# Patient Record
Sex: Female | Born: 1990
Health system: Southern US, Community
[De-identification: ages and names within clinical notes are randomized; demographics above are authoritative.]

## PROBLEM LIST (undated history)

## (undated) ENCOUNTER — Inpatient Hospital Stay (HOSPITAL_COMMUNITY): Payer: Self-pay

## (undated) DIAGNOSIS — F32A Depression, unspecified: Secondary | ICD-10-CM

## (undated) DIAGNOSIS — F1910 Other psychoactive substance abuse, uncomplicated: Secondary | ICD-10-CM

## (undated) DIAGNOSIS — N760 Acute vaginitis: Secondary | ICD-10-CM

## (undated) DIAGNOSIS — F329 Major depressive disorder, single episode, unspecified: Secondary | ICD-10-CM

## (undated) DIAGNOSIS — B9689 Other specified bacterial agents as the cause of diseases classified elsewhere: Secondary | ICD-10-CM

## (undated) DIAGNOSIS — B1920 Unspecified viral hepatitis C without hepatic coma: Secondary | ICD-10-CM

## (undated) HISTORY — PX: NO PAST SURGERIES: SHX2092

---
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
## 2013-01-11 ENCOUNTER — Ambulatory Visit (INDEPENDENT_AMBULATORY_CARE_PROVIDER_SITE_OTHER): Payer: 59 | Admitting: Nurse Practitioner

## 2013-01-11 ENCOUNTER — Encounter: Payer: Self-pay | Admitting: Nurse Practitioner

## 2013-01-11 VITALS — BP 106/70 | Temp 99.1°F | Wt 119.6 lb

## 2013-01-11 DIAGNOSIS — J069 Acute upper respiratory infection, unspecified: Secondary | ICD-10-CM

## 2013-01-11 DIAGNOSIS — J209 Acute bronchitis, unspecified: Secondary | ICD-10-CM

## 2013-01-11 MED ORDER — HYDROCODONE-HOMATROPINE 5-1.5 MG/5ML PO SYRP: 5.0000 mL | ORAL_SOLUTION | ORAL | Status: DC | PRN

## 2013-01-11 MED ORDER — CEFPROZIL 500 MG PO TABS: 500.0000 mg | ORAL_TABLET | Freq: Two times a day (BID) | ORAL | Status: DC

## 2013-01-11 MED ORDER — CEFTRIAXONE SODIUM 500 MG IJ SOLR
500.0000 mg | Freq: Once | INTRAMUSCULAR | Status: AC
  Administered 2013-01-11: 500 mg via INTRAMUSCULAR

## 2013-01-11 MED ORDER — PREDNISONE 20 MG PO TABS: ORAL_TABLET | ORAL | Status: DC

## 2013-01-11 MED ORDER — ALBUTEROL SULFATE HFA 108 (90 BASE) MCG/ACT IN AERS: 2.0000 | INHALATION_SPRAY | RESPIRATORY_TRACT | Status: DC | PRN

## 2013-01-11 NOTE — Progress Notes (Signed)
Subjective:  Presents for complaints of cough and congestion for the past several days. Began with an intestinal virus, seen at the local ER on 3/16 for IV fluids. Since then has developed a frequent cough producing green mucus. Chest pain and tightness with deep breath and cough. Slight shortness of breath/wheezing at times especially at night. No ear pain. Some headache. Sore throat. Taking fluids well. Voiding normally. Temp 102 this morning.  Objective:   BP 106/70  Temp(Src) 99.1 F (37.3 C) (Oral)  Wt 119 lb 9.6 oz (54.25 kg) NAD. Alert, oriented. TMs clear effusion, no erythema. Pharynx minimal erythema, tonsils 2+ in size, no exudate noted. PND noted. Neck supple with mild soft slightly tender anterior cervical adenopathy. Lungs scattered faint expiratory crackles, mildly diminished breath sounds. No tachypnea. Normal color. Slight shortness of breath with talking. Heart regular rate rhythm.

## 2013-01-11 NOTE — Assessment & Plan Note (Signed)
Prescribed antibiotics, prednisone, albuterol inhaler and Hycodan syrup. As a precaution to cover the possibility early pneumonia, given an injection of Rocephin 500 mg. Call back in 4 days if no significant improvement, call or go to ER over the weekend if worse.

## 2013-01-17 ENCOUNTER — Encounter: Payer: Self-pay | Admitting: *Deleted

## 2013-01-22 ENCOUNTER — Ambulatory Visit: Payer: 59 | Admitting: Nurse Practitioner

## 2013-02-01 ENCOUNTER — Telehealth: Payer: Self-pay | Admitting: Nurse Practitioner

## 2013-02-01 NOTE — Telephone Encounter (Signed)
Pt needs refill on her Klonopin 1 mg Q4-6h or as needed

## 2013-02-02 ENCOUNTER — Other Ambulatory Visit: Payer: Self-pay | Admitting: Nurse Practitioner

## 2013-02-02 MED ORDER — CLONAZEPAM 1 MG PO TABS: ORAL_TABLET | ORAL | Status: DC

## 2013-02-08 ENCOUNTER — Encounter: Payer: 59 | Admitting: Family Medicine

## 2013-02-09 ENCOUNTER — Ambulatory Visit (INDEPENDENT_AMBULATORY_CARE_PROVIDER_SITE_OTHER): Payer: 59 | Admitting: Family Medicine

## 2013-02-09 ENCOUNTER — Encounter: Payer: Self-pay | Admitting: Family Medicine

## 2013-02-09 ENCOUNTER — Telehealth: Payer: Self-pay | Admitting: Family Medicine

## 2013-02-09 VITALS — BP 110/76 | Temp 99.1°F | Wt 118.0 lb

## 2013-02-09 DIAGNOSIS — I889 Nonspecific lymphadenitis, unspecified: Secondary | ICD-10-CM

## 2013-02-09 MED ORDER — AMOXICILLIN-POT CLAVULANATE 875-125 MG PO TABS: 1.0000 | ORAL_TABLET | Freq: Two times a day (BID) | ORAL | Status: AC

## 2013-02-09 NOTE — Progress Notes (Signed)
This encounter was created in error - please disregard.

## 2013-02-09 NOTE — Telephone Encounter (Signed)
We'll see, (booo)

## 2013-02-09 NOTE — Progress Notes (Signed)
  Subjective:    Patient ID: Shelia Anderson, female    DOB: 20-May-1991, 22 y.o.   MRN: [id number]  Sore Throat  This is a new problem. The current episode started in the past 7 days. The problem has been resolved. Associated symptoms include headaches and swollen glands. She has tried NSAIDs for the symptoms. The treatment provided mild relief.  Fever  Associated symptoms include headaches.      Review of Systems  Constitutional: Positive for fever.  Neurological: Positive for headaches.   Review systems otherwise negative.    Objective:   Physical Exam  Alert moderate now lays. HEENT positive frontal tenderness. Pharynx erythematous. Neck supple. Lungs rare rhonchi. No wheezes no crackles heart rare in rhythm.      Assessment & Plan:  Impression sinusitis with element of bronchitis. Plan Augmentin 875 twice a day 10 days. Symptomatic care discussed. WSL
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
## 2013-04-18 ENCOUNTER — Ambulatory Visit: Payer: 59 | Admitting: Nurse Practitioner

## 2013-04-18 DIAGNOSIS — Z0289 Encounter for other administrative examinations: Secondary | ICD-10-CM

## 2014-06-25 ENCOUNTER — Emergency Department (HOSPITAL_COMMUNITY)
Admission: EM | Admit: 2014-06-25 | Discharge: 2014-06-25 | Disposition: A | Discharge status: Home / Self Care | Payer: 59 | Attending: Emergency Medicine | Admitting: Emergency Medicine

## 2014-06-25 ENCOUNTER — Encounter (HOSPITAL_COMMUNITY): Payer: Self-pay | Admitting: Emergency Medicine

## 2014-06-25 DIAGNOSIS — Z046 Encounter for general psychiatric examination, requested by authority: Secondary | ICD-10-CM | POA: Insufficient documentation

## 2014-06-25 DIAGNOSIS — Z8619 Personal history of other infectious and parasitic diseases: Secondary | ICD-10-CM | POA: Diagnosis not present

## 2014-06-25 DIAGNOSIS — Z8742 Personal history of other diseases of the female genital tract: Secondary | ICD-10-CM | POA: Insufficient documentation

## 2014-06-25 DIAGNOSIS — F1210 Cannabis abuse, uncomplicated: Secondary | ICD-10-CM | POA: Diagnosis not present

## 2014-06-25 DIAGNOSIS — F17200 Nicotine dependence, unspecified, uncomplicated: Secondary | ICD-10-CM | POA: Insufficient documentation

## 2014-06-25 DIAGNOSIS — Z3202 Encounter for pregnancy test, result negative: Secondary | ICD-10-CM | POA: Insufficient documentation

## 2014-06-25 DIAGNOSIS — F1410 Cocaine abuse, uncomplicated: Secondary | ICD-10-CM | POA: Insufficient documentation

## 2014-06-25 DIAGNOSIS — Z91040 Latex allergy status: Secondary | ICD-10-CM | POA: Insufficient documentation

## 2014-06-25 DIAGNOSIS — F1910 Other psychoactive substance abuse, uncomplicated: Secondary | ICD-10-CM

## 2014-06-25 HISTORY — DX: Acute vaginitis: N76.0

## 2014-06-25 HISTORY — DX: Other specified bacterial agents as the cause of diseases classified elsewhere: B96.89

## 2014-06-25 LAB — CBC WITH DIFFERENTIAL/PLATELET
BASOS ABS: 0 10*3/uL (ref 0.0–0.1)
BASOS PCT: 1 % (ref 0–1)
EOS ABS: 0.2 10*3/uL (ref 0.0–0.7)
Eosinophils Relative: 2 % (ref 0–5)
HCT: 43.2 % (ref 36.0–46.0)
HEMOGLOBIN: 15 g/dL (ref 12.0–15.0)
Lymphocytes Relative: 27 % (ref 12–46)
Lymphs Abs: 2.3 10*3/uL (ref 0.7–4.0)
MCH: 33.6 pg (ref 26.0–34.0)
MCHC: 34.7 g/dL (ref 30.0–36.0)
MCV: 96.9 fL (ref 78.0–100.0)
MONOS PCT: 8 % (ref 3–12)
Monocytes Absolute: 0.7 10*3/uL (ref 0.1–1.0)
NEUTROS PCT: 62 % (ref 43–77)
Neutro Abs: 5.6 10*3/uL (ref 1.7–7.7)
PLATELETS: 423 10*3/uL — AB (ref 150–400)
RBC: 4.46 MIL/uL (ref 3.87–5.11)
RDW: 12.9 % (ref 11.5–15.5)
WBC: 8.8 10*3/uL (ref 4.0–10.5)

## 2014-06-25 LAB — RAPID URINE DRUG SCREEN, HOSP PERFORMED
AMPHETAMINES: NOT DETECTED
Barbiturates: NOT DETECTED
Benzodiazepines: NOT DETECTED
Cocaine: NOT DETECTED
Opiates: NOT DETECTED
TETRAHYDROCANNABINOL: POSITIVE — AB

## 2014-06-25 LAB — BASIC METABOLIC PANEL
ANION GAP: 10 (ref 5–15)
BUN: 8 mg/dL (ref 6–23)
CO2: 25 mEq/L (ref 19–32)
Calcium: 9.1 mg/dL (ref 8.4–10.5)
Chloride: 105 mEq/L (ref 96–112)
Creatinine, Ser: 0.78 mg/dL (ref 0.50–1.10)
Glucose, Bld: 95 mg/dL (ref 70–99)
POTASSIUM: 4.1 meq/L (ref 3.7–5.3)
Sodium: 140 mEq/L (ref 137–147)

## 2014-06-25 LAB — ETHANOL: Alcohol, Ethyl (B): <11 mg/dL (ref 0–11)

## 2014-06-25 LAB — PREGNANCY, URINE: PREG TEST UR: NEGATIVE

## 2014-06-25 MED ORDER — ONDANSETRON HCL 4 MG PO TABS: 4.0000 mg | ORAL_TABLET | Freq: Four times a day (QID) | ORAL | Status: DC

## 2014-06-25 MED ORDER — CLONIDINE HCL 0.1 MG PO TABS: 0.1000 mg | ORAL_TABLET | Freq: Two times a day (BID) | ORAL | Status: DC

## 2014-06-25 MED ORDER — LOPERAMIDE HCL 2 MG PO CAPS: 2.0000 mg | ORAL_CAPSULE | Freq: Four times a day (QID) | ORAL | Status: DC | PRN

## 2014-06-25 MED ORDER — METRONIDAZOLE 500 MG PO TABS: 2000.0000 mg | ORAL_TABLET | Freq: Once | ORAL | Status: DC

## 2014-06-25 NOTE — ED Notes (Signed)
Pt denies SI or HI .  

## 2014-06-25 NOTE — Discharge Instructions (Signed)
Polysubstance Abuse When people abuse more than one drug or type of drug it is called polysubstance or polydrug abuse. For example, many smokers also drink alcohol. This is one form of polydrug abuse. Polydrug abuse also refers to the use of a drug to counteract an unpleasant effect produced by another drug. It may also be used to help with withdrawal from another drug. People who take stimulants may become agitated. Sometimes this agitation is countered with a tranquilizer. This helps protect against the unpleasant side effects. Polydrug abuse also refers to the use of different drugs at the same time.  Anytime drug use is interfering with normal living activities, it has become abuse. This includes problems with family and friends. Psychological dependence has developed when your mind tells you that the drug is needed. This is usually followed by physical dependence which has developed when continuing increases of drug are required to get the same feeling or "high". This is known as addiction or chemical dependency. A person's risk is much higher if there is a history of chemical dependency in the family. SIGNS OF CHEMICAL DEPENDENCY  You have been told by friends or family that drugs have become a problem.  You fight when using drugs.  You are having blackouts (not remembering what you do while using).  You feel sick from using drugs but continue using.  You lie about use or amounts of drugs (chemicals) used.  You need chemicals to get you going.  You are suffering in work performance or in school because of drug use.  You get sick from use of drugs but continue to use anyway.  You need drugs to relate to people or feel comfortable in social situations.  You use drugs to forget problems. "Yes" answered to any of the above signs of chemical dependency indicates there are problems. The longer the use of drugs continues, the greater the problems will become. If there is a family history of  drug or alcohol use, it is best not to experiment with these drugs. Continual use leads to tolerance. After tolerance develops more of the drug is needed to get the same feeling. This is followed by addiction. With addiction, drugs become the most important part of life. It becomes more important to take drugs than participate in the other usual activities of life. This includes relating to friends and family. Addiction is followed by dependency. Dependency is a condition where drugs are now needed not just to get high, but to feel normal. Addiction cannot be cured but it can be stopped. This often requires outside help and the care of professionals. Treatment centers are listed in the yellow pages under: Cocaine, Narcotics, and Alcoholics Anonymous. Most hospitals and clinics can refer you to a specialized care center. Talk to your caregiver if you need help. Document Released: 05/26/2005 Document Revised: 12/27/2011 Document Reviewed: 10/04/2005 [facility] Patient Information 2015 [place], [place]. This information is not intended to replace advice given to you by your health care provider. Make sure you discuss any questions you have with your health care provider.      Behavioral Health Resources in the [facility]  Intensive Outpatient Programs: [facility]      [address] [place], [place] [phone] Both a day and evening program       [facility] Outpatient     [address]        [place], [place] [address] [phone]  [facility]: [facility] [address] [place] [phone]  [facility] Mental Health ACCESS LINE: [phone] or [phone] [address] [place], [place] [address] [email or web address]  Mobile Crisis Teams:                                        [facility]         Mobile Crisis Care Unit [phone]             [facility] [address] [place] Otto [phone]                                         [facility] [address] [place] [phone]  Self-Help/Support Groups: [facility] [employer] of support groups [phone] (call for more info)  Narcotics Anonymous (NA) [facility] [address] [place] - 2 meetings at this location  Residential Treatment Programs:  [facility]      [address]        [place]       [phone]         [facility] [address], [place] [address] [place], [place]  [address] [phone]  [facility]  [address] [place], [place] [address] [phone] Admissions: 8am-3pm M-F  [facility]     [address]        [place], [place] [address]       [phone]         [facility] [address] [place], [place] [phone]  [facility] [address] [place], [place] [phone]  [facility] - Intensive Outpatient      [address]     [place], [place]       [phone]         [facility] ([facility]     [address] [place], [place] [phone] or [phone]  [facility] ([facility])  [address] [place], [place] [phone]  [facility] [address]                                               [address] [place] [phone]  [facility] Resources: [phone]               General Therapy                                                Angie Fava, PhD        [address] [place], [place] [address]         [phone]   Insurance  [facility]   [address] [place], [place] [address]  [phone]  [facility] [address] [place], [place] [address] [phone] Insurance/Medicaid/sponsorship  through [facility] and Families                                              [address]                                        [place], [place] [address]    Therapy/tele-psych/case         [phone]          [facility] [address], [place]  [address]  Adolescent/group home/case management [phone]                                           Creola Corn PhD       General therapy       Insurance   [phone]         Dr. Lolly Mustache Insurance [phone] M-F  [place] [facility]Residential Medicaid, sponsorship [phone]

## 2014-06-25 NOTE — ED Provider Notes (Signed)
CSN: [id number]     Arrival date & time 06/25/14  1249 History   First MD Initiated Contact with Patient 06/25/14 1526     Chief Complaint  Patient presents with  . V70.1   HPI PT has been abusing crack cocaine since January and has also been taking pain pills for 2 years.  Pt has also been abusing prescription drugs including narcotic pain pills.  No benzodiazepines.  NO alcohol.  Pt would like to get help with that.  She denies any SI or HI.  She called behavioral health hospital and was told to come to the ED to stay for 3 days.  Pt also misplaced her flagyl that she was given previously at another ED.  Past Medical History  Diagnosis Date  . BV (bacterial vaginosis)    History reviewed. No pertinent past surgical history. History reviewed. No pertinent family history. History  Substance Use Topics  . Smoking status: Current Every Day Smoker -- 0.30 packs/day    Types: Cigarettes  . Smokeless tobacco: Never Used  . Alcohol Use: No   OB History   Grav Para Term Preterm Abortions TAB SAB Ect Mult Living                 Review of Systems  All other systems reviewed and are negative.     Allergies  Latex  Home Medications   Prior to Admission medications   Not on File   BP 122/70  Pulse 85  Temp(Src) 98.6 F (37 C) (Oral)  Resp 18  Ht  (1.651 m)  Wt 128 lb (58.06 kg)  BMI 21.30 kg/m2  SpO2 100%  LMP 06/24/2014 Physical Exam  Nursing note and vitals reviewed. Constitutional: She appears well-developed and well-nourished. No distress.  HENT:  Head: Normocephalic and atraumatic.  Right Ear: External ear normal.  Left Ear: External ear normal.  Eyes: Conjunctivae are normal. Right eye exhibits no discharge. Left eye exhibits no discharge. No scleral icterus.  Neck: Neck supple. No tracheal deviation present.  Cardiovascular: Normal rate, regular rhythm and intact distal pulses.   Pulmonary/Chest: Effort normal and breath sounds normal. No stridor. No  respiratory distress. She has no wheezes. She has no rales.  Abdominal: Soft. Bowel sounds are normal. She exhibits no distension. There is no tenderness. There is no rebound and no guarding.  Musculoskeletal: She exhibits no edema and no tenderness.  Neurological: She is alert. She has normal strength. No cranial nerve deficit (no facial droop, extraocular movements intact, no slurred speech) or sensory deficit. She exhibits normal muscle tone. She displays no seizure activity. Coordination normal.  Skin: Skin is warm and dry. No rash noted.  Psychiatric: She has a normal mood and affect. Her affect is not labile and not inappropriate. Her speech is not delayed and not tangential. Thought content is not delusional. She expresses no homicidal and no suicidal ideation.    ED Course  Procedures (including critical care time) Labs Review Labs Reviewed  CBC WITH DIFFERENTIAL - Abnormal; Notable for the following:    Platelets 423 (*)    All other components within normal limits  BASIC METABOLIC PANEL  ETHANOL  PREGNANCY, URINE  URINE RAPID DRUG SCREEN (HOSP PERFORMED)    Imaging Review No results found.   EKG Interpretation None      MDM   Final diagnoses:  Substance abuse    Pt is requesting help for with substance abuse.  She is not suicidal.  Not abusing alcohol  or benzodiazepines.  Will dc home with rx to help with her withdrawal.  Will provide resource guide.  Discussed with Berneice Heinrich from [facility] and she agrees that patient would not need to be seen in [facility] ED for 3 days for detox.  They would not give that information out.  There are very few inpatient resources in the states for inpatient opiate detox.  Linwood Dibbles, MD 06/25/14 [phone]

## 2014-06-25 NOTE — ED Notes (Signed)
Pt wanting detox from pain prescription drugs and crack/cocaine; pt states recently dx with BV and lost prescription and has not been taking the antibiotics prescribed
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
## 2015-10-31 ENCOUNTER — Encounter (HOSPITAL_COMMUNITY): Payer: Self-pay | Admitting: Emergency Medicine

## 2015-10-31 ENCOUNTER — Emergency Department (HOSPITAL_COMMUNITY)
Admission: EM | Admit: 2015-10-31 | Discharge: 2015-11-01 | Disposition: A | Discharge status: Home / Self Care | Payer: Self-pay | Attending: Emergency Medicine | Admitting: Emergency Medicine

## 2015-10-31 DIAGNOSIS — F329 Major depressive disorder, single episode, unspecified: Secondary | ICD-10-CM | POA: Insufficient documentation

## 2015-10-31 DIAGNOSIS — Z8742 Personal history of other diseases of the female genital tract: Secondary | ICD-10-CM | POA: Insufficient documentation

## 2015-10-31 DIAGNOSIS — Z79899 Other long term (current) drug therapy: Secondary | ICD-10-CM | POA: Insufficient documentation

## 2015-10-31 DIAGNOSIS — F1110 Opioid abuse, uncomplicated: Secondary | ICD-10-CM | POA: Insufficient documentation

## 2015-10-31 DIAGNOSIS — S0012XA Contusion of left eyelid and periocular area, initial encounter: Secondary | ICD-10-CM | POA: Insufficient documentation

## 2015-10-31 DIAGNOSIS — Y9289 Other specified places as the place of occurrence of the external cause: Secondary | ICD-10-CM | POA: Insufficient documentation

## 2015-10-31 DIAGNOSIS — Z8619 Personal history of other infectious and parasitic diseases: Secondary | ICD-10-CM | POA: Insufficient documentation

## 2015-10-31 DIAGNOSIS — F17210 Nicotine dependence, cigarettes, uncomplicated: Secondary | ICD-10-CM | POA: Insufficient documentation

## 2015-10-31 DIAGNOSIS — F1910 Other psychoactive substance abuse, uncomplicated: Secondary | ICD-10-CM

## 2015-10-31 DIAGNOSIS — O99320 Drug use complicating pregnancy, unspecified trimester: Secondary | ICD-10-CM | POA: Diagnosis present

## 2015-10-31 DIAGNOSIS — Y998 Other external cause status: Secondary | ICD-10-CM | POA: Insufficient documentation

## 2015-10-31 DIAGNOSIS — Z91040 Latex allergy status: Secondary | ICD-10-CM | POA: Insufficient documentation

## 2015-10-31 DIAGNOSIS — S00212A Abrasion of left eyelid and periocular area, initial encounter: Secondary | ICD-10-CM | POA: Insufficient documentation

## 2015-10-31 DIAGNOSIS — Y9389 Activity, other specified: Secondary | ICD-10-CM | POA: Insufficient documentation

## 2015-10-31 DIAGNOSIS — Z3202 Encounter for pregnancy test, result negative: Secondary | ICD-10-CM | POA: Insufficient documentation

## 2015-10-31 HISTORY — DX: Major depressive disorder, single episode, unspecified: F32.9

## 2015-10-31 HISTORY — DX: Other psychoactive substance abuse, uncomplicated: F19.10

## 2015-10-31 HISTORY — DX: Depression, unspecified: F32.A

## 2015-10-31 LAB — ETHANOL: ALCOHOL ETHYL (B): 10 mg/dL — AB (ref <5)

## 2015-10-31 LAB — RAPID URINE DRUG SCREEN, HOSP PERFORMED
AMPHETAMINES: NOT DETECTED
Barbiturates: NOT DETECTED
Benzodiazepines: NOT DETECTED
Cocaine: NOT DETECTED
Opiates: POSITIVE — AB
TETRAHYDROCANNABINOL: NOT DETECTED

## 2015-10-31 LAB — CBC WITH DIFFERENTIAL/PLATELET
Basophils Absolute: 0 10*3/uL (ref 0.0–0.1)
Basophils Relative: 0 %
EOS ABS: 0.1 10*3/uL (ref 0.0–0.7)
EOS PCT: 1 %
HCT: 40.3 % (ref 36.0–46.0)
HEMOGLOBIN: 13.7 g/dL (ref 12.0–15.0)
LYMPHS ABS: 2.1 10*3/uL (ref 0.7–4.0)
LYMPHS PCT: 19 %
MCH: 32.9 pg (ref 26.0–34.0)
MCHC: 34 g/dL (ref 30.0–36.0)
MCV: 96.9 fL (ref 78.0–100.0)
MONOS PCT: 6 %
Monocytes Absolute: 0.7 10*3/uL (ref 0.1–1.0)
Neutro Abs: 8.2 10*3/uL — ABNORMAL HIGH (ref 1.7–7.7)
Neutrophils Relative %: 74 %
PLATELETS: 452 10*3/uL — AB (ref 150–400)
RBC: 4.16 MIL/uL (ref 3.87–5.11)
RDW: 12.7 % (ref 11.5–15.5)
WBC: 11.1 10*3/uL — AB (ref 4.0–10.5)

## 2015-10-31 LAB — BASIC METABOLIC PANEL
Anion gap: 9 (ref 5–15)
BUN: 10 mg/dL (ref 6–20)
CHLORIDE: 106 mmol/L (ref 101–111)
CO2: 22 mmol/L (ref 22–32)
CREATININE: 0.73 mg/dL (ref 0.44–1.00)
Calcium: 8.8 mg/dL — ABNORMAL LOW (ref 8.9–10.3)
GFR calc Af Amer: >60 mL/min (ref >60)
GFR calc non Af Amer: >60 mL/min (ref >60)
GLUCOSE: 75 mg/dL (ref 65–99)
POTASSIUM: 3.4 mmol/L — AB (ref 3.5–5.1)
SODIUM: 137 mmol/L (ref 135–145)

## 2015-10-31 LAB — PREGNANCY, URINE: PREG TEST UR: NEGATIVE
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
## 2015-10-31 NOTE — ED Notes (Signed)
Assumed care of patient at this time. Officer at the bedside at this time. Labs and urine ordered. Pt changed into paper scrubs. Belongings searched and place in belongings bag and labels and secured in [employer]
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
## 2015-10-31 NOTE — ED Notes (Signed)
Officer Observation form completed, Pt calm, no complaints

## 2015-11-01 ENCOUNTER — Emergency Department (HOSPITAL_COMMUNITY): Payer: 59

## 2015-11-01 DIAGNOSIS — F1910 Other psychoactive substance abuse, uncomplicated: Secondary | ICD-10-CM

## 2015-11-01 DIAGNOSIS — O99320 Drug use complicating pregnancy, unspecified trimester: Secondary | ICD-10-CM | POA: Diagnosis present

## 2015-11-01 DIAGNOSIS — S0512XA Contusion of eyeball and orbital tissues, left eye, initial encounter: Secondary | ICD-10-CM | POA: Diagnosis not present

## 2015-11-01 MED ORDER — NICOTINE 14 MG/24HR TD PT24
14.0000 mg | MEDICATED_PATCH | Freq: Every day | TRANSDERMAL | Status: DC
  Administered 2015-11-01 (×2): 14 mg via TRANSDERMAL
  Filled 2015-11-01 (×2): qty 1

## 2015-11-01 MED ORDER — ACETAMINOPHEN 325 MG PO TABS
650.0000 mg | ORAL_TABLET | Freq: Once | ORAL | Status: AC
  Administered 2015-11-01: 650 mg via ORAL
  Filled 2015-11-01: qty 2

## 2015-11-01 MED ORDER — LORAZEPAM 1 MG PO TABS: 1.0000 mg | ORAL_TABLET | Freq: Three times a day (TID) | ORAL | Status: DC | PRN

## 2015-11-01 MED ORDER — ONDANSETRON HCL 4 MG PO TABS: 4.0000 mg | ORAL_TABLET | Freq: Three times a day (TID) | ORAL | Status: DC | PRN

## 2015-11-01 MED ORDER — IBUPROFEN 400 MG PO TABS: 600.0000 mg | ORAL_TABLET | Freq: Three times a day (TID) | ORAL | Status: DC | PRN

## 2015-11-01 NOTE — ED Provider Notes (Signed)
No suicidal or homicidal ideation. No psychosis. Behavioral health consult obtained. Will discharge.  Donnetta HutchingBrian Dustine Bertini, MD 11/01/15 1038
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
## 2015-11-01 NOTE — Discharge Instructions (Signed)
Follow-up with community mental health resources °

## 2015-11-01 NOTE — BH Assessment (Signed)
0010:  Schedule tele-assessment  0030:  Per Extender Hulan FessIjeoma Nwaeze:  Patient will be reevaluated by psychiatry in the morning.  0038:  Provided Patient disposition to Dr. Bebe ShaggyWickline.

## 2015-11-01 NOTE — ED Notes (Signed)
Pt being re-evaluated via tele-psych at this time.

## 2015-11-01 NOTE — ED Notes (Signed)
Pt resting with eyes closed, sitter remains at bedside,  
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
## 2015-11-01 NOTE — ED Provider Notes (Signed)
IVC Completed prior to arrival by family 1st exam will need completion once final disposition is set Pt stable at this time, resting comfortably   Zadie Rhineonald Sherril Heyward, MD 11/01/15 [id number]

## 2017-10-18 NOTE — L&D Delivery Note (Signed)
Delivery Note At 6:40 PM a viable female was delivered via Vaginal, Spontaneous (Presentation: LOA;  ).  APGAR: 8, 9; weight  pending.   Placenta status: manual removal, complete .  Cord: 3VC with the following complications: 30 mins after NSVD placenta had not delivered. No signs of placental separation noted. Cervix closed, so pitocin infusion stopped and Dr. Shawnie Pons called to the room to assist. Dr. Shawnie Pons performed manual removal of the placenta .  Cord pH: NA  Anesthesia:  Epidural, local Episiotomy: None Lacerations: 1st degree peri-clitoral  Suture Repair: 4-0 monocryl Est. Blood Loss (mL): 363  Mom to postpartum.  Baby to Couplet care / Skin to Skin.  Thressa Sheller 07/15/2018, 7:31 PM

## 2017-12-22 ENCOUNTER — Encounter (HOSPITAL_COMMUNITY): Payer: Self-pay

## 2017-12-22 ENCOUNTER — Inpatient Hospital Stay (HOSPITAL_COMMUNITY): Payer: Medicaid Other

## 2017-12-22 ENCOUNTER — Other Ambulatory Visit: Payer: Self-pay

## 2017-12-22 ENCOUNTER — Inpatient Hospital Stay (HOSPITAL_COMMUNITY)
Admission: AD | Admit: 2017-12-22 | Discharge: 2017-12-22 | Disposition: A | Discharge status: Home / Self Care | Payer: Medicaid Other | Source: Ambulatory Visit | Attending: Obstetrics and Gynecology | Admitting: Obstetrics and Gynecology

## 2017-12-22 DIAGNOSIS — O4690 Antepartum hemorrhage, unspecified, unspecified trimester: Secondary | ICD-10-CM

## 2017-12-22 DIAGNOSIS — O418X10 Other specified disorders of amniotic fluid and membranes, first trimester, not applicable or unspecified: Secondary | ICD-10-CM

## 2017-12-22 DIAGNOSIS — Z3491 Encounter for supervision of normal pregnancy, unspecified, first trimester: Secondary | ICD-10-CM

## 2017-12-22 DIAGNOSIS — Z91040 Latex allergy status: Secondary | ICD-10-CM | POA: Diagnosis not present

## 2017-12-22 DIAGNOSIS — O209 Hemorrhage in early pregnancy, unspecified: Secondary | ICD-10-CM | POA: Insufficient documentation

## 2017-12-22 DIAGNOSIS — O4691 Antepartum hemorrhage, unspecified, first trimester: Secondary | ICD-10-CM

## 2017-12-22 DIAGNOSIS — N939 Abnormal uterine and vaginal bleeding, unspecified: Secondary | ICD-10-CM | POA: Diagnosis present

## 2017-12-22 DIAGNOSIS — F17210 Nicotine dependence, cigarettes, uncomplicated: Secondary | ICD-10-CM | POA: Diagnosis not present

## 2017-12-22 DIAGNOSIS — B9689 Other specified bacterial agents as the cause of diseases classified elsewhere: Secondary | ICD-10-CM | POA: Diagnosis not present

## 2017-12-22 DIAGNOSIS — O99331 Smoking (tobacco) complicating pregnancy, first trimester: Secondary | ICD-10-CM | POA: Insufficient documentation

## 2017-12-22 DIAGNOSIS — Z79899 Other long term (current) drug therapy: Secondary | ICD-10-CM | POA: Diagnosis not present

## 2017-12-22 DIAGNOSIS — Z3A01 Less than 8 weeks gestation of pregnancy: Secondary | ICD-10-CM | POA: Insufficient documentation

## 2017-12-22 DIAGNOSIS — O23591 Infection of other part of genital tract in pregnancy, first trimester: Secondary | ICD-10-CM | POA: Diagnosis not present

## 2017-12-22 DIAGNOSIS — O208 Other hemorrhage in early pregnancy: Secondary | ICD-10-CM | POA: Insufficient documentation

## 2017-12-22 DIAGNOSIS — O468X1 Other antepartum hemorrhage, first trimester: Secondary | ICD-10-CM

## 2017-12-22 DIAGNOSIS — N760 Acute vaginitis: Secondary | ICD-10-CM

## 2017-12-22 LAB — URINALYSIS, ROUTINE W REFLEX MICROSCOPIC
BACTERIA UA: NONE SEEN
Bilirubin Urine: NEGATIVE
Glucose, UA: NEGATIVE mg/dL
Ketones, ur: NEGATIVE mg/dL
Nitrite: NEGATIVE
PROTEIN: NEGATIVE mg/dL
RBC / HPF: NONE SEEN RBC/hpf (ref 0–5)
SPECIFIC GRAVITY, URINE: 1.01 (ref 1.005–1.030)
pH: 6 (ref 5.0–8.0)

## 2017-12-22 LAB — CBC
HEMATOCRIT: 39.4 % (ref 36.0–46.0)
HEMOGLOBIN: 13.9 g/dL (ref 12.0–15.0)
MCH: 32.3 pg (ref 26.0–34.0)
MCHC: 35.3 g/dL (ref 30.0–36.0)
MCV: 91.4 fL (ref 78.0–100.0)
Platelets: 403 10*3/uL — ABNORMAL HIGH (ref 150–400)
RBC: 4.31 MIL/uL (ref 3.87–5.11)
RDW: 13.3 % (ref 11.5–15.5)
WBC: 7.1 10*3/uL (ref 4.0–10.5)

## 2017-12-22 LAB — ABO/RH: ABO/RH(D): A NEG

## 2017-12-22 LAB — WET PREP, GENITAL
Sperm: NONE SEEN
TRICH WET PREP: NONE SEEN
YEAST WET PREP: NONE SEEN

## 2017-12-22 LAB — HCG, QUANTITATIVE, PREGNANCY: hCG, Beta Chain, Quant, S: 32170 m[IU]/mL — ABNORMAL HIGH (ref <5)

## 2017-12-22 LAB — POCT PREGNANCY, URINE: Preg Test, Ur: POSITIVE — AB

## 2017-12-22 MED ORDER — METRONIDAZOLE 500 MG PO TABS: 500.0000 mg | ORAL_TABLET | Freq: Two times a day (BID) | ORAL | 0 refills | Status: DC

## 2017-12-22 MED ORDER — RHO D IMMUNE GLOBULIN 1500 UNIT/2ML IJ SOSY
300.0000 ug | PREFILLED_SYRINGE | Freq: Once | INTRAMUSCULAR | Status: AC
  Administered 2017-12-22: 300 ug via INTRAMUSCULAR
  Filled 2017-12-22: qty 2

## 2017-12-22 NOTE — MAU Provider Note (Signed)
History     CSN: [id number]  Arrival date and time: 12/22/17 1455   First Provider Initiated Contact with Patient 12/22/17 1527     Chief Complaint  Patient presents with  . Vaginal Bleeding  . Vaginal Discharge  . Vaginal Itching   HPI Shelia Anderson is a 27 y.o. G1P0 at [redacted]w[redacted]d who presents with vaginal spotting for 1 week. She states she had her pregnancy confirmed at the health department about a month ago. She states last week she noticed spotting when she wiped and it has been daily since. She states she also has a discharge with an odor and reports a history of frequent BV infections. She denies any pain. She plans to get prenatal care at [facility].    OB History    Gravida Para Term Preterm AB Living   1             SAB TAB Ectopic Multiple Live Births                  Past Medical History:  Diagnosis Date  . BV (bacterial vaginosis)   . Depression   . Substance abuse (HCC)     No past surgical history on file.  No family history on file.  Social History   Tobacco Use  . Smoking status: Current Every Day Smoker    Packs/day: 0.30    Types: Cigarettes  . Smokeless tobacco: Never Used  Substance Use Topics  . Alcohol use: No  . Drug use: Yes    Types: Cocaine, Marijuana    Comment: used heroin last night    Allergies:  Allergies  Allergen Reactions  . Latex     Medications Prior to Admission  Medication Sig Dispense Refill Last Dose  . cloNIDine (CATAPRES) 0.1 MG tablet Take 1 tablet (0.1 mg total) by mouth 2 (two) times daily. 10 tablet 0   . loperamide (IMODIUM) 2 MG capsule Take 1 capsule (2 mg total) by mouth 4 (four) times daily as needed for diarrhea or loose stools. 12 capsule 0   . metroNIDAZOLE (FLAGYL) 500 MG tablet Take 4 tablets (2,000 mg total) by mouth once. 4 tablet 0   . ondansetron (ZOFRAN) 4 MG tablet Take 1 tablet (4 mg total) by mouth every 6 (six) hours. 12 tablet 0     Review of Systems  Constitutional: Negative.   Negative for fatigue and fever.  HENT: Negative.   Respiratory: Negative.  Negative for shortness of breath.   Cardiovascular: Negative.  Negative for chest pain.  Gastrointestinal: Negative.  Negative for abdominal pain, constipation, diarrhea, nausea and vomiting.  Genitourinary: Positive for vaginal bleeding and vaginal discharge. Negative for dysuria.  Neurological: Negative.  Negative for dizziness and headaches.   Physical Exam   Blood pressure 134/75, pulse 83, temperature 98.8 F (37.1 C), temperature source Oral, resp. rate 16.  Physical Exam  Nursing note and vitals reviewed. Constitutional: She is oriented to person, place, and time. She appears well-developed and well-nourished. No distress.  HENT:  Head: Normocephalic.  Eyes: Pupils are equal, round, and reactive to light.  Cardiovascular: Normal rate, regular rhythm and normal heart sounds.  Respiratory: Effort normal and breath sounds normal. No respiratory distress.  GI: Soft. Bowel sounds are normal. She exhibits no distension. There is no tenderness.  Genitourinary: Vaginal discharge found.  Neurological: She is alert and oriented to person, place, and time.  Skin: Skin is warm and dry.  Psychiatric: She has a  normal mood and affect. Her behavior is normal. Judgment and thought content normal.   Pelvic exam: Cervix pink, visually closed, without lesion, scant pink discharge, vaginal walls and external genitalia normal Bimanual exam: Cervix 0/long/high, firm, anterior, neg CMT, uterus nontender, nonenlarged, adnexa without tenderness, enlargement, or mass  MAU Course  Procedures Results for orders placed or performed during the hospital encounter of 12/22/17 (from the past 24 hour(s))  Urinalysis, Routine w reflex microscopic     Status: Abnormal   Collection Time: 12/22/17  3:05 PM  Result Value Ref Range   Color, Urine YELLOW YELLOW   APPearance CLEAR CLEAR   Specific Gravity, Urine 1.010 1.005 - 1.030   pH  6.0 5.0 - 8.0   Glucose, UA NEGATIVE NEGATIVE mg/dL   Hgb urine dipstick SMALL (A) NEGATIVE   Bilirubin Urine NEGATIVE NEGATIVE   Ketones, ur NEGATIVE NEGATIVE mg/dL   Protein, ur NEGATIVE NEGATIVE mg/dL   Nitrite NEGATIVE NEGATIVE   Leukocytes, UA TRACE (A) NEGATIVE   RBC / HPF NONE SEEN 0 - 5 RBC/hpf   WBC, UA 0-5 0 - 5 WBC/hpf   Bacteria, UA NONE SEEN NONE SEEN   Squamous Epithelial / LPF 6-30 (A) NONE SEEN  Pregnancy, urine POC     Status: Abnormal   Collection Time: 12/22/17  3:29 PM  Result Value Ref Range   Preg Test, Ur POSITIVE (A) NEGATIVE  Wet prep, genital     Status: Abnormal   Collection Time: 12/22/17  3:36 PM  Result Value Ref Range   Yeast Wet Prep HPF POC NONE SEEN NONE SEEN   Trich, Wet Prep NONE SEEN NONE SEEN   Clue Cells Wet Prep HPF POC PRESENT (A) NONE SEEN   WBC, Wet Prep HPF POC MODERATE (A) NONE SEEN   Sperm NONE SEEN   CBC     Status: Abnormal   Collection Time: 12/22/17  3:46 PM  Result Value Ref Range   WBC 7.1 4.0 - 10.5 K/uL   RBC 4.31 3.87 - 5.11 MIL/uL   Hemoglobin 13.9 12.0 - 15.0 g/dL   HCT 16.139.4 09.636.0 - 04.546.0 %   MCV 91.4 78.0 - 100.0 fL   MCH 32.3 26.0 - 34.0 pg   MCHC 35.3 30.0 - 36.0 g/dL   RDW 40.913.3 81.111.5 - 91.415.5 %   Platelets 403 (H) 150 - 400 K/uL   [place] Ob Less Than 14 Weeks With Ob Transvaginal  Result Date: 12/22/2017 CLINICAL DATA:  Spotting for 1 week. EXAM: OBSTETRIC <14 WK US AND TRANSVAGINAL OB US TECHNIQUE: Both transabdominal and transvaginal ultrasound examinations were performed for complete evaluation of the gestation as well as the maternal uterus, adnexal regions, and pelvic cul-de-sac. Transvaginal technique was performed to assess early pregnancy. COMPARISON:  None. FINDINGS: Intrauterine gestational sac: Single Yolk sac:  Visualized. Embryo:  Visualized. Cardiac Activity: Visualized. Heart Rate: 150 bpm CRL:  12.7 mm   7 w   3 d                  US EDC: 08/07/2018 Maternal uterus/adnexae: Subchorionic hemorrhage: Small, this  measures approximately 1.6 x 0.7 cm. Right ovary: Normal Left ovary: Normal Other :None Free fluid:  None IMPRESSION: 1. Single living intrauterine gestation has an estimated gestational age of [redacted] weeks and 3 days. 2. Small subchorionic hemorrhage noted. Electronically Signed   By: Signa Kellaylor  Stroud M.D.   On: 12/22/2017 16:37   MDM UA, UPT CBC, HCG ABO/Rh- A Neg Wet prep and gc/chlamydia  US OB Transvaginal US OB Comp Less 14 weeks  Rhogam work up Rhophylac  Assessment and Plan   1. Normal intrauterine pregnancy on prenatal ultrasound in first trimester   2. Vaginal bleeding in pregnancy   3. [redacted] weeks gestation of pregnancy   4. Bacterial vaginosis   5. Subchorionic hemorrhage of placenta in first trimester, single or unspecified fetus    -Discharge home in stable condition -Rx for metronidazole given to patient -Vaginal bleeding precautions discussed -Patient advised to follow-up with [facility] as scheduled to start prenatal care -Patient may return to MAU as needed or if her condition were to change or worsen  Rolm Bookbinder CNM 12/22/2017, 3:40 PM

## 2017-12-22 NOTE — MAU Note (Addendum)
G1 early pregnant. Went to health department and pregn with urine and +. Started bleeding less than a week ago. Scant amount. noticed foul smelling vaginal discharge. Last intercourse was 4-5 days ago.   Last LMP Jan 15/16? Unsure.   Provider at bs assessing. wetprep and GC with pelvic exam done.  Lab at bs  Pt to U/s

## 2017-12-22 NOTE — Discharge Instructions (Signed)
,  Safe Medications in Pregnancy  ° °Acne: °Benzoyl Peroxide °Salicylic Acid ° °Backache/Headache: °Tylenol: 2 regular strength every 4 hours OR °             2 Extra strength every 6 hours ° °Colds/Coughs/Allergies: °Benadryl (alcohol free) 25 mg every 6 hours as needed °Breath right strips °Claritin °Cepacol throat lozenges °Chloraseptic throat spray °Cold-Eeze- up to three times per day °Cough drops, alcohol free °Flonase (by prescription only) °Guaifenesin °Mucinex °Robitussin DM (plain only, alcohol free) °Saline nasal spray/drops °Sudafed (pseudoephedrine) & Actifed ** use only after [redacted] weeks gestation and if you do not have high blood pressure °Tylenol °Vicks Vaporub °Zinc lozenges °Zyrtec  ° °Constipation: °Colace °Ducolax suppositories °Fleet enema °Glycerin suppositories °Metamucil °Milk of magnesia °Miralax °Senokot °Smooth move tea ° °Diarrhea: °Kaopectate °Imodium A-D ° °*NO pepto Bismol ° °Hemorrhoids: °Anusol °Anusol HC °Preparation H °Tucks ° °Indigestion: °Tums °Maalox °Mylanta °Zantac  °Pepcid ° °Insomnia: °Benadryl (alcohol free) 25mg every 6 hours as needed °Tylenol PM °Unisom, no Gelcaps ° °Leg Cramps: °Tums °MagGel ° °Nausea/Vomiting:  °Bonine °Dramamine °Emetrol °Ginger extract °Sea bands °Meclizine  °Nausea medication to take during pregnancy:  °Unisom (doxylamine succinate 25 mg tablets) Take one tablet daily at bedtime. If symptoms are not adequately controlled, the dose can be increased to a maximum recommended dose of two tablets daily (1/2 tablet in the morning, 1/2 tablet mid-afternoon and one at bedtime). °Vitamin B6 100mg tablets. Take one tablet twice a day (up to 200 mg per day). ° °Skin Rashes: °Aveeno products °Benadryl cream or 25mg every 6 hours as needed °Calamine Lotion °1% cortisone cream ° °Yeast infection: °Gyne-lotrimin 7 °Monistat 7 ° ° °**If taking multiple medications, please check labels to avoid duplicating the same active ingredients °**take medication as directed on  the label °** Do not exceed 4000 mg of tylenol in 24 hours °**Do not take medications that contain aspirin or ibuprofen ° ° ° °[facility]  ° ° °[facility]       Phone: [phone] ° °[facility] [place] Phone: [phone] ° °[facility] [place]  Phone: [phone] ° °[facility] [place]  Phone: [phone] ° °[facility] [place]  Phone: [phone] ° °[facility]       Phone: [phone] ° °[facility]    Phone: [phone]  ° °[facility]    Phone: [phone] ° °[facility]    Phone: [phone] ° °[facility]    Phone: [phone] ° °[facility]    Phone: [phone] ° °[facility]       Phone: [phone]  ° °[facility]  Phone: [phone] ° °[facility]    Phone: [phone] ° °[facility]   Phone: [phone] ° °[facility]      Phone: [phone] ° °[facility]    Phone: [phone] ° ° °

## 2017-12-23 LAB — RH IG WORKUP (INCLUDES ABO/RH)
ABO/RH(D): A NEG
Antibody Screen: NEGATIVE
Gestational Age(Wks): 7
UNIT DIVISION: 0

## 2017-12-24 LAB — GC/CHLAMYDIA PROBE AMP (~~LOC~~) NOT AT ARMC
Chlamydia: NEGATIVE
NEISSERIA GONORRHEA: NEGATIVE

## 2018-01-09 ENCOUNTER — Other Ambulatory Visit: Payer: Self-pay | Admitting: Obstetrics and Gynecology

## 2018-01-10 ENCOUNTER — Other Ambulatory Visit: Payer: Self-pay | Admitting: Obstetrics and Gynecology

## 2018-01-12 ENCOUNTER — Inpatient Hospital Stay (HOSPITAL_COMMUNITY)
Admission: AD | Admit: 2018-01-12 | Discharge: 2018-01-12 | Discharge status: Absent without leave | Payer: Medicaid Other | Source: Ambulatory Visit | Attending: Obstetrics and Gynecology | Admitting: Obstetrics and Gynecology

## 2018-01-12 ENCOUNTER — Other Ambulatory Visit (HOSPITAL_COMMUNITY)
Admission: AD | Admit: 2018-01-12 | Discharge: 2018-01-12 | Disposition: A | Discharge status: Home / Self Care | Payer: Medicaid Other | Source: Ambulatory Visit | Attending: Obstetrics and Gynecology | Admitting: Obstetrics and Gynecology

## 2018-02-07 ENCOUNTER — Other Ambulatory Visit (HOSPITAL_COMMUNITY)
Admission: AD | Admit: 2018-02-07 | Discharge: 2018-02-07 | Disposition: A | Discharge status: Home / Self Care | Payer: 59 | Source: Ambulatory Visit | Attending: Obstetrics and Gynecology | Admitting: Obstetrics and Gynecology

## 2018-02-07 DIAGNOSIS — Z36 Encounter for antenatal screening for chromosomal anomalies: Secondary | ICD-10-CM | POA: Insufficient documentation

## 2018-02-07 LAB — ANTIBODY SCREEN: Antibody Screen: POSITIVE

## 2018-02-27 ENCOUNTER — Encounter: Payer: Self-pay | Admitting: Obstetrics & Gynecology

## 2018-03-08 ENCOUNTER — Ambulatory Visit (INDEPENDENT_AMBULATORY_CARE_PROVIDER_SITE_OTHER): Payer: Medicaid Other | Admitting: Obstetrics & Gynecology

## 2018-03-08 ENCOUNTER — Encounter: Payer: Self-pay | Admitting: Obstetrics & Gynecology

## 2018-03-08 VITALS — BP 106/73 | HR 81 | Wt 152.1 lb

## 2018-03-08 DIAGNOSIS — O99332 Smoking (tobacco) complicating pregnancy, second trimester: Secondary | ICD-10-CM

## 2018-03-08 DIAGNOSIS — O99330 Smoking (tobacco) complicating pregnancy, unspecified trimester: Secondary | ICD-10-CM

## 2018-03-08 DIAGNOSIS — O99322 Drug use complicating pregnancy, second trimester: Secondary | ICD-10-CM

## 2018-03-08 DIAGNOSIS — O98412 Viral hepatitis complicating pregnancy, second trimester: Secondary | ICD-10-CM

## 2018-03-08 DIAGNOSIS — O99320 Drug use complicating pregnancy, unspecified trimester: Secondary | ICD-10-CM

## 2018-03-08 DIAGNOSIS — Z3400 Encounter for supervision of normal first pregnancy, unspecified trimester: Secondary | ICD-10-CM

## 2018-03-08 DIAGNOSIS — O98419 Viral hepatitis complicating pregnancy, unspecified trimester: Secondary | ICD-10-CM | POA: Insufficient documentation

## 2018-03-08 DIAGNOSIS — B1710 Acute hepatitis C without hepatic coma: Secondary | ICD-10-CM

## 2018-03-08 DIAGNOSIS — O219 Vomiting of pregnancy, unspecified: Secondary | ICD-10-CM | POA: Insufficient documentation

## 2018-03-08 MED ORDER — DOXYLAMINE-PYRIDOXINE 10-10 MG PO TBEC: 2.0000 | DELAYED_RELEASE_TABLET | Freq: Every day | ORAL | 5 refills | Status: DC

## 2018-03-08 NOTE — Patient Instructions (Signed)
Second Trimester of Pregnancy The second trimester is from week 13 through week 28, month 4 through 6. This is often the time in pregnancy that you feel your best. Often times, morning sickness has lessened or quit. You may have more energy, and you may get hungry more often. Your unborn baby (fetus) is growing rapidly. At the end of the sixth month, he or she is about 9 inches long and weighs about 1 pounds. You will likely feel the baby move (quickening) between 18 and 20 weeks of pregnancy. Follow these instructions at home:  Avoid all smoking, herbs, and alcohol. Avoid drugs not approved by your doctor.  Do not use any tobacco products, including cigarettes, chewing tobacco, and electronic cigarettes. If you need help quitting, ask your doctor. You may get counseling or other support to help you quit.  Only take medicine as told by your doctor. Some medicines are safe and some are not during pregnancy.  Exercise only as told by your doctor. Stop exercising if you start having cramps.  Eat regular, healthy meals.  Wear a good support bra if your breasts are tender.  Do not use hot tubs, steam rooms, or saunas.  Wear your seat belt when driving.  Avoid raw meat, uncooked cheese, and liter boxes and soil used by cats.  Take your prenatal vitamins.  Take 1500-2000 milligrams of calcium daily starting at the 20th week of pregnancy until you deliver your baby.  Try taking medicine that helps you poop (stool softener) as needed, and if your doctor approves. Eat more fiber by eating fresh fruit, vegetables, and whole grains. Drink enough fluids to keep your pee (urine) clear or pale yellow.  Take warm water baths (sitz baths) to soothe pain or discomfort caused by hemorrhoids. Use hemorrhoid cream if your doctor approves.  If you have puffy, bulging veins (varicose veins), wear support hose. Raise (elevate) your feet for 15 minutes, 3-4 times a day. Limit salt in your diet.  Avoid heavy  lifting, wear low heals, and sit up straight.  Rest with your legs raised if you have leg cramps or low back pain.  Visit your dentist if you have not gone during your pregnancy. Use a soft toothbrush to brush your teeth. Be gentle when you floss.  You can have sex (intercourse) unless your doctor tells you not to.  Go to your doctor visits. Get help if:  You feel dizzy.  You have mild cramps or pressure in your lower belly (abdomen).  You have a nagging pain in your belly area.  You continue to feel sick to your stomach (nauseous), throw up (vomit), or have watery poop (diarrhea).  You have bad smelling fluid coming from your vagina.  You have pain with peeing (urination). Get help right away if:  You have a fever.  You are leaking fluid from your vagina.  You have spotting or bleeding from your vagina.  You have severe belly cramping or pain.  You lose or gain weight rapidly.  You have trouble catching your breath and have chest pain.  You notice sudden or extreme puffiness (swelling) of your face, hands, ankles, feet, or legs.  You have not felt the baby move in over an hour.  You have severe headaches that do not go away with medicine.  You have vision changes. This information is not intended to replace advice given to you by your health care provider. Make sure you discuss any questions you have with your health care   provider. Document Released: 12/29/2009 Document Revised: 03/11/2016 Document Reviewed: 12/05/2012 Elsevier Interactive Patient Education  2017 Elsevier Inc.  

## 2018-03-08 NOTE — Progress Notes (Signed)
  Subjective:    Shelia Anderson is being seen today for her first obstetrical visit.  This is not a planned pregnancy. She is at [redacted]w[redacted]d gestation. Her obstetrical history is significant for smoker and Hep C. Relationship with FOB: significant other, living together. Patient does intend to breast feed. Pregnancy history fully reviewed.  Pt has a h/o drug use. 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 Pt was on the patch prev but, was smoking at the same time.     Patient reports no complaints.  Review of Systems:   Review of Systems  Objective:    BP 106/73   Pulse 81   Wt 152 lb 1.6 oz (69 kg)   LMP 11/01/2017 (Exact Date)   Breastfeeding? Unknown   BMI 26.11 kg/m  Physical Exam BP 106/73   Pulse 81   Wt 152 lb 1.6 oz (69 kg)   LMP 11/01/2017 (Exact Date)   Breastfeeding? Unknown   BMI 26.11 kg/m   CONSTITUTIONAL: Well-developed, well-nourished female in no acute distress.  HENT:  Normocephalic, atraumatic EYES: Conjunctivae and EOM are normal. No scleral icterus.  NECK: Normal range of motion SKIN: Skin is warm and dry. No rash noted. Not diaphoretic.No pallor. NEUROLGIC: Alert and oriented to person, place, and time. Normal coordination.  Abd: FH 2cm below umb See flowsheet     Assessment:    Pregnancy: G1P0 Patient Active Problem List   Diagnosis Date Noted  . Supervision of normal first pregnancy, antepartum 03/08/2018  . Tobacco use affecting pregnancy, antepartum 03/08/2018  . Nausea and vomiting of pregnancy, antepartum 03/08/2018  . Substance abuse affecting pregnancy, antepartum   . Acute upper respiratory infection 01/11/2013  . Acute bronchitis 01/11/2013       Plan:     Initial labs drawn. Prenatal vitamins. Problem list reviewed and updated. AFP3 discussed: pt had the first trimester screening prev. . Role of ultrasound in pregnancy discussed; fetal survey: requested. Amniocentesis discussed: not indicated. Follow up in 4 weeks. 60% of 45 min  visit spent on counseling and coordination of care.  Need records from [facility] Referral to Hepatology for Hep C Drug screen q trimester- pt gave verbal consent to testing  Rec Tob cessation for pt and partner.    Shelia Anderson 03/08/2018

## 2018-03-09 ENCOUNTER — Telehealth: Payer: Self-pay

## 2018-03-09 NOTE — Addendum Note (Signed)
Addended by: Anell Barr on: 03/09/2018 02:24 PM   Modules accepted: Orders

## 2018-03-09 NOTE — Telephone Encounter (Signed)
Patient referral to Hepatology led me to [facility] in [place]  Phone [phone] Fax [phone]  When speaking with them about placing referreal the office states we must fill out referral form and that they are reviewed by the doctors every Wednesday and then the referral coordinators contact the patient for new patient appointments. Faxed referral form to [facility] [place]. Armandina Stammer RN

## 2018-03-10 LAB — AFP TETRA
DIA MOM VALUE: 2.06
DIA Value (EIA): 355.16 pg/mL
DSR (BY AGE) 1 IN: 797
DSR (SECOND TRIMESTER) 1 IN: 3223
GESTATIONAL AGE AFP: 18.1 wk
MATERNAL AGE AT EDD: 28.8 a
MSAFP Mom: 1.6
MSAFP: 68.1 ng/mL
MSHCG Mom: 0.71
MSHCG: 20378 m[IU]/mL
OSB RISK: 2100
T18 (By Age): 1:3104 {titer}
Test Results:: NEGATIVE
UE3 VALUE: 2.2 ng/mL
WEIGHT: 152 [lb_av]
uE3 Mom: 1.69

## 2018-03-22 ENCOUNTER — Ambulatory Visit (HOSPITAL_COMMUNITY)
Admission: RE | Admit: 2018-03-22 | Discharge: 2018-03-22 | Disposition: A | Discharge status: Home / Self Care | Payer: Medicaid Other | Source: Ambulatory Visit | Attending: Obstetrics & Gynecology | Admitting: Obstetrics & Gynecology

## 2018-03-22 ENCOUNTER — Encounter (HOSPITAL_COMMUNITY): Payer: Self-pay

## 2018-03-22 DIAGNOSIS — Z3400 Encounter for supervision of normal first pregnancy, unspecified trimester: Secondary | ICD-10-CM

## 2018-03-22 DIAGNOSIS — O99322 Drug use complicating pregnancy, second trimester: Secondary | ICD-10-CM | POA: Insufficient documentation

## 2018-03-22 DIAGNOSIS — Z3A20 20 weeks gestation of pregnancy: Secondary | ICD-10-CM | POA: Diagnosis not present

## 2018-03-22 DIAGNOSIS — O99330 Smoking (tobacco) complicating pregnancy, unspecified trimester: Secondary | ICD-10-CM

## 2018-03-22 DIAGNOSIS — O99320 Drug use complicating pregnancy, unspecified trimester: Secondary | ICD-10-CM

## 2018-03-22 DIAGNOSIS — O99332 Smoking (tobacco) complicating pregnancy, second trimester: Secondary | ICD-10-CM | POA: Insufficient documentation

## 2018-03-22 DIAGNOSIS — Z3482 Encounter for supervision of other normal pregnancy, second trimester: Secondary | ICD-10-CM | POA: Diagnosis present

## 2018-03-22 HISTORY — DX: Unspecified viral hepatitis C without hepatic coma: B19.20

## 2018-03-23 ENCOUNTER — Other Ambulatory Visit (HOSPITAL_COMMUNITY): Payer: Self-pay | Admitting: *Deleted

## 2018-03-23 DIAGNOSIS — Z362 Encounter for other antenatal screening follow-up: Secondary | ICD-10-CM

## 2018-03-27 ENCOUNTER — Ambulatory Visit: Payer: Self-pay | Admitting: Obstetrics & Gynecology

## 2018-03-27 ENCOUNTER — Telehealth: Payer: Self-pay

## 2018-03-27 NOTE — Telephone Encounter (Signed)
Called patient because she missed routine ob appointment today and patient states she had it on her calendar as next week.   Rescheduled her Ob appointment to next Monday 04-03-18 at 9:45a. Armandina StammerJennifer Howard RN

## 2018-03-28 IMAGING — US US OB < 14 WEEKS - US OB TV
1 series · 15 of 28 positions shown · non-contrast
Comparison: None.

CLINICAL DATA: Spotting for 1 week.

EXAM:
OBSTETRIC <14 WK US AND TRANSVAGINAL OB US
TECHNIQUE: Both transabdominal and transvaginal ultrasound examinations were
performed for complete evaluation of the gestation as well as the
maternal uterus, adnexal regions, and pelvic cul-de-sac.
Transvaginal technique was performed to assess early pregnancy.

[Series 1: us ob < 14 weeks - us ob tv · 15 of 60 slices shown]
[im 1/60]
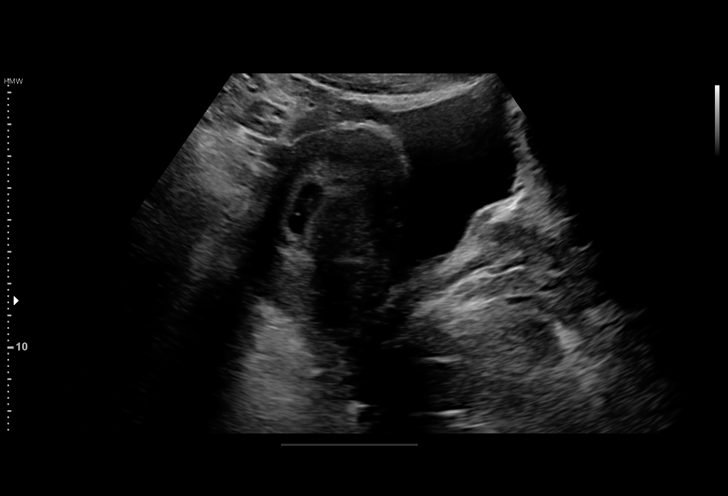
[im 5/60]
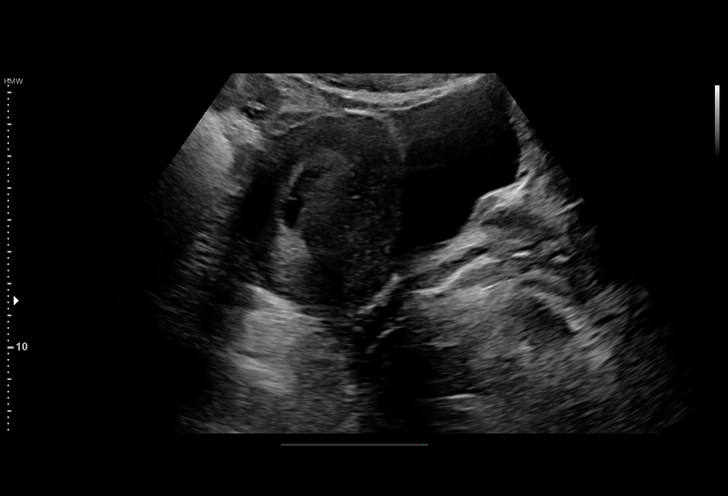
[im 9/60]
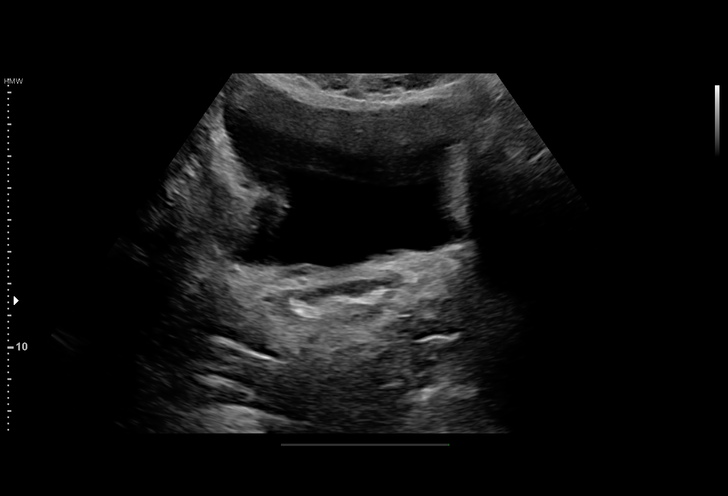
[im 14/60]
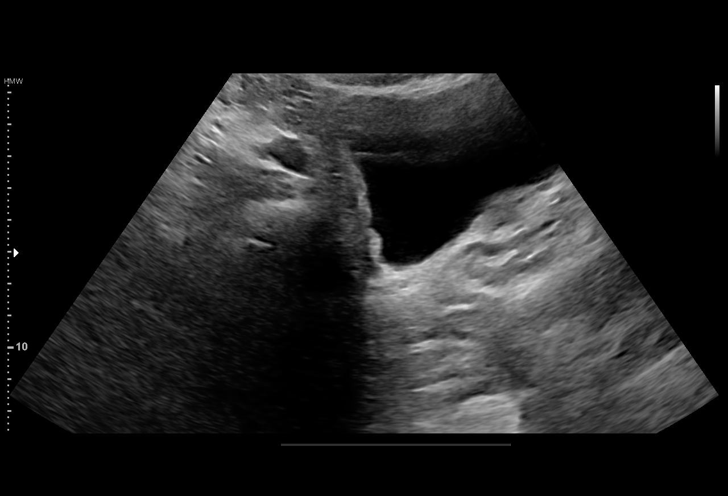
[im 18/60]
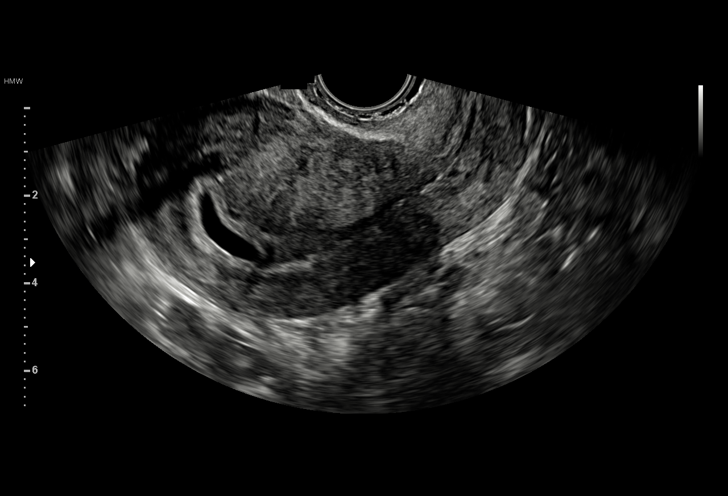
[im 22/60]
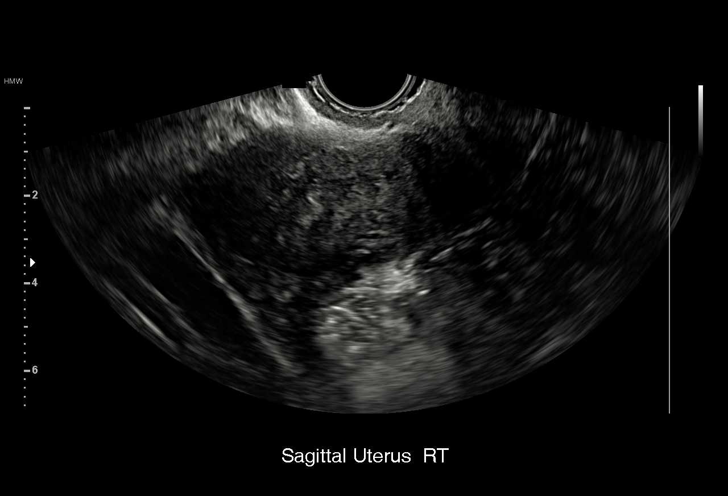
[im 27/60]
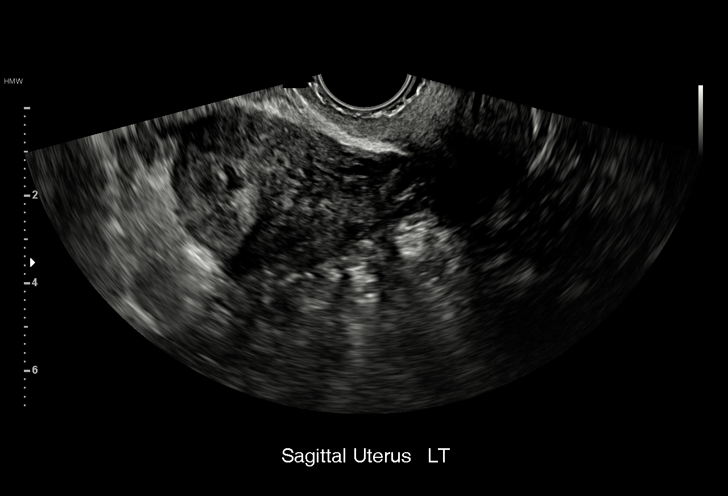
[im 31/60]
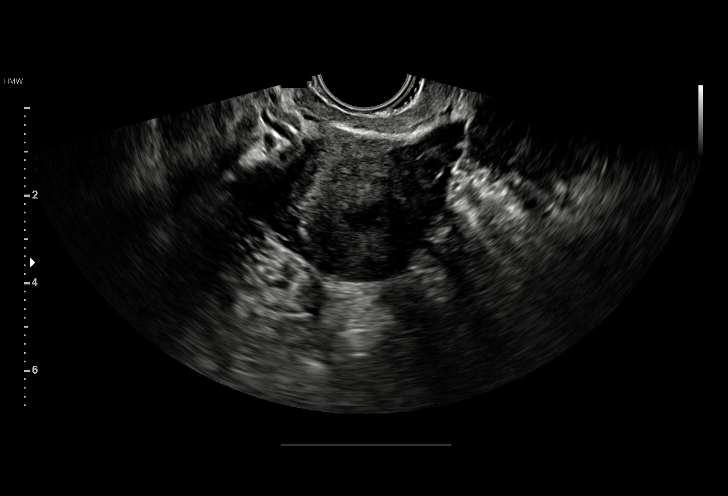
[im 33/60]
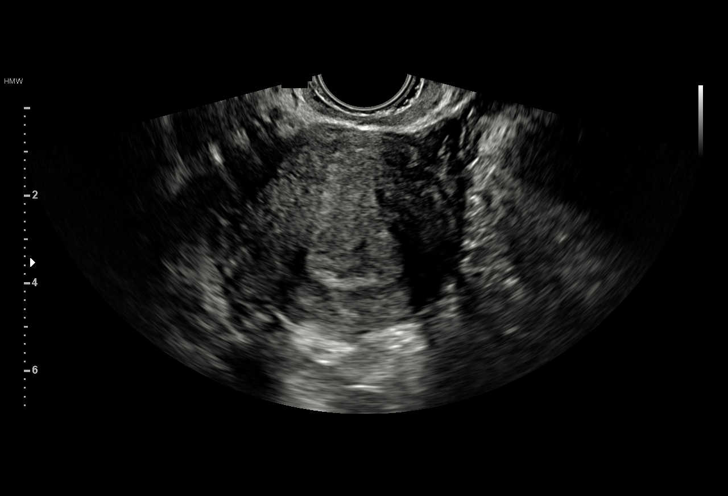
[im 38/60]
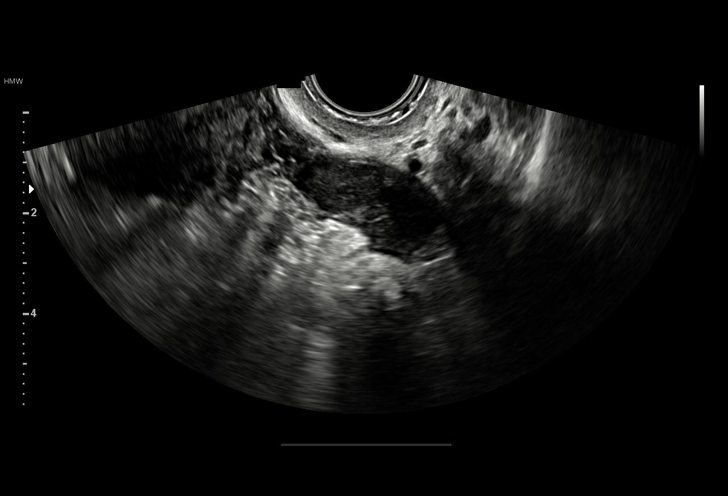
[im 42/60]
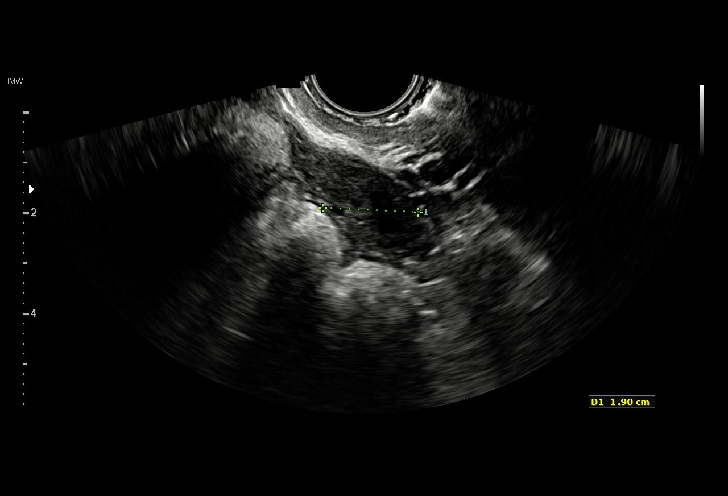
[im 46/60]
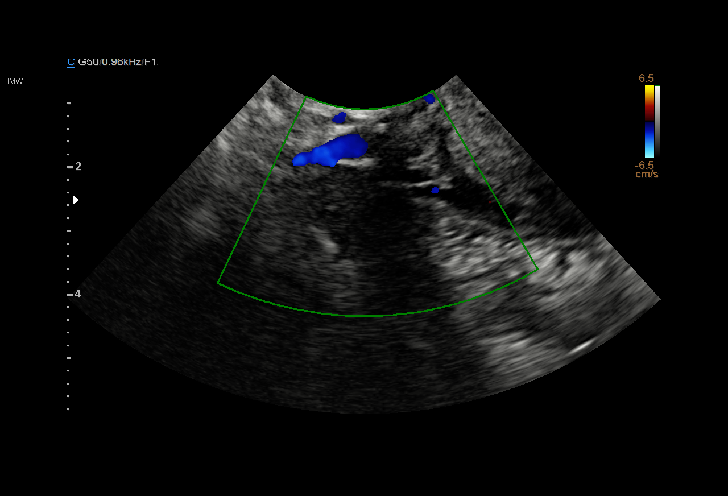
[im 51/60]
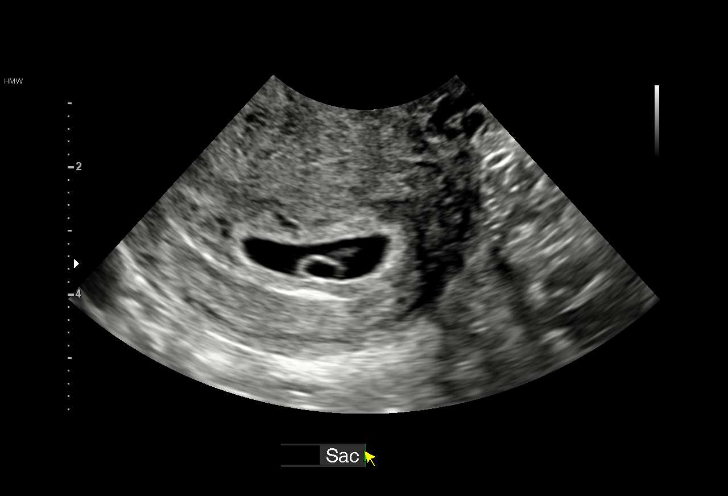
[im 55/60]
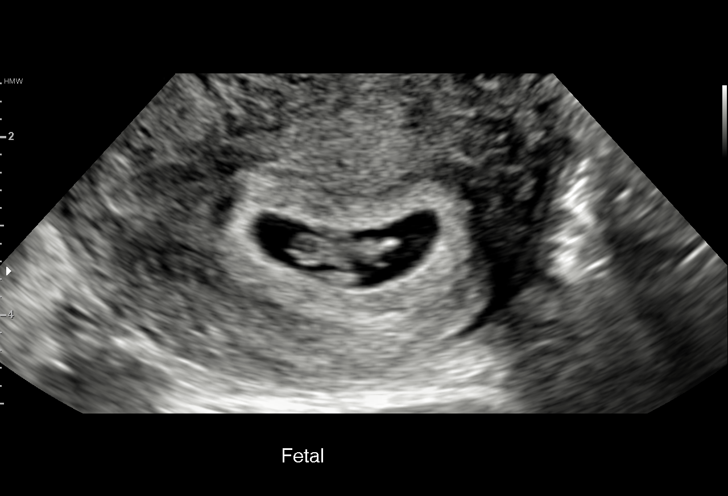
[im 60/60]
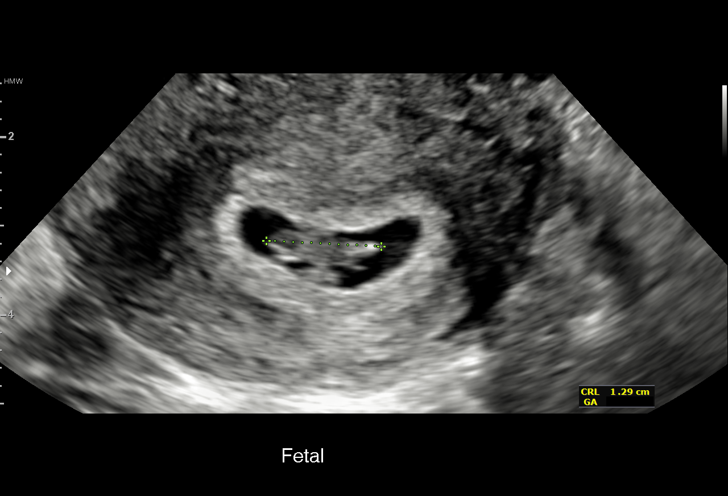

[15 of 28 positions shown; findings below may reference images not displayed]

FINDINGS: Intrauterine gestational sac: Single

Yolk sac:  Visualized.

Embryo:  Visualized.

Cardiac Activity: Visualized.

Heart Rate: 150 bpm

CRL:  12.7 mm   7 w   3 d                  US EDC: 08/07/2018

Maternal uterus/adnexae:

Subchorionic hemorrhage: Small, this measures approximately 1.6 x
0.7 cm.

Right ovary: Normal

Left ovary: Normal

Other :None

Free fluid:  None
IMPRESSION: 1. Single living intrauterine gestation has an estimated gestational
age of 7 weeks and 3 days.
2. Small subchorionic hemorrhage noted.

## 2018-04-03 ENCOUNTER — Encounter: Payer: Self-pay | Admitting: Family Medicine

## 2018-04-04 ENCOUNTER — Ambulatory Visit (INDEPENDENT_AMBULATORY_CARE_PROVIDER_SITE_OTHER): Payer: Medicaid Other | Admitting: Advanced Practice Midwife

## 2018-04-04 VITALS — BP 108/64 | HR 78 | Wt 158.9 lb

## 2018-04-04 DIAGNOSIS — Z3402 Encounter for supervision of normal first pregnancy, second trimester: Secondary | ICD-10-CM

## 2018-04-04 DIAGNOSIS — Z3400 Encounter for supervision of normal first pregnancy, unspecified trimester: Secondary | ICD-10-CM

## 2018-04-04 DIAGNOSIS — B1920 Unspecified viral hepatitis C without hepatic coma: Secondary | ICD-10-CM

## 2018-04-05 ENCOUNTER — Encounter: Payer: Self-pay | Admitting: Advanced Practice Midwife

## 2018-04-05 NOTE — Progress Notes (Signed)
   PRENATAL VISIT NOTE  Subjective:  Shelia Anderson is a 27 y.o. G1P0 at 378w1d being seen today for ongoing prenatal care.  She is currently monitored for the following issues for this low-risk pregnancy and has Acute upper respiratory infection; Acute bronchitis; Substance abuse affecting pregnancy, antepartum; Supervision of normal first pregnancy, antepartum; Tobacco use affecting pregnancy, antepartum; and Nausea and vomiting of pregnancy, antepartum on their problem list.  Patient reports no complaints.  Contractions: Not present. Vag. Bleeding: None.  Movement: Present. Denies leaking of fluid.   The following portions of the patient's history were reviewed and updated as appropriate: allergies, current medications, past family history, past medical history, past social history, past surgical history and problem list. Problem list updated.  Objective:   Vitals:   04/04/18 0950  BP: 108/64  Pulse: 78  Weight: 158 lb 14.4 oz (72.1 kg)    Fetal Status: Fetal Heart Rate (bpm): 149   Movement: Present     General:  Alert, oriented and cooperative. Patient is in no acute distress.  Skin: Skin is warm and dry. No rash noted.   Cardiovascular: Normal heart rate noted  Respiratory: Normal respiratory effort, no problems with respiration noted  Abdomen: Soft, gravid, appropriate for gestational age.  Pain/Pressure: Present     Pelvic: Cervical exam deferred        Extremities: Normal range of motion.  Edema: None  Mental Status: Normal mood and affect. Normal behavior. Normal judgment and thought content.   Assessment and Plan:  Pregnancy: G1P0 at 978w1d  Reviewed AFP Tetra is negative US for anatomy was incomplete but so far is normal with ant/post placenta, above os  Preterm labor symptoms and general obstetric precautions including but not limited to vaginal bleeding, contractions, leaking of fluid and fetal movement were reviewed in detail with the patient. Please refer to  After Visit Summary for other counseling recommendations.  RTO 1 month  Future Appointments  Date Time Provider Department Center  04/19/2018 11:00 AM [facility] US 3 [facility]  05/04/2018  8:30 AM Levie HeritageStinson, Jacob J, DO [facility] None    Wynelle BourgeoisMarie Pachia Strum, CNM

## 2018-04-05 NOTE — Progress Notes (Deleted)
   PRENATAL VISIT NOTE  Subjective:  Shelia Anderson is a 27 y.o. G1P0 at 5251w1d being seen today for ongoing prenatal care.  She is currently monitored for the following issues for this low-risk pregnancy and has Acute upper respiratory infection; Acute bronchitis; Substance abuse affecting pregnancy, antepartum; Supervision of normal first pregnancy, antepartum; Tobacco use affecting pregnancy, antepartum; and Nausea and vomiting of pregnancy, antepartum on their problem list.  Patient reports no complaints.  Contractions: Not present. Vag. Bleeding: None.  Movement: Present. Denies leaking of fluid.   The following portions of the patient's history were reviewed and updated as appropriate: allergies, current medications, past family history, past medical history, past social history, past surgical history and problem list. Problem list updated.  Objective:   Vitals:   04/04/18 0950  BP: 108/64  Pulse: 78  Weight: 158 lb 14.4 oz (72.1 kg)    Fetal Status: Fetal Heart Rate (bpm): 149   Movement: Present     General:  Alert, oriented and cooperative. Patient is in no acute distress.  Skin: Skin is warm and dry. No rash noted.   Cardiovascular: Normal heart rate noted  Respiratory: Normal respiratory effort, no problems with respiration noted  Abdomen: Soft, gravid, appropriate for gestational age.  Pain/Pressure: Present     Pelvic: Cervical exam deferred        Extremities: Normal range of motion.  Edema: None  Mental Status: Normal mood and affect. Normal behavior. Normal judgment and thought content.   Assessment and Plan:  Pregnancy: G1P0 at 1751w1d  Discussed AFP Tetra is negative.  Preterm labor symptoms and general obstetric precautions including but not limited to vaginal bleeding, contractions, leaking of fluid and fetal movement were reviewed in detail with the patient. Please refer to After Visit Summary for other counseling recommendations.   Return to office in 4  weeks  Future Appointments  Date Time Provider Department Center  04/19/2018 11:00 AM [facility] US 3 [facility]  05/04/2018  8:30 AM Levie HeritageStinson, Jacob J, DO [facility] None    Wynelle BourgeoisMarie Mable Lashley, CNM

## 2018-04-05 NOTE — Patient Instructions (Signed)
Second Trimester of Pregnancy The second trimester is from week 13 through week 28, month 4 through 6. This is often the time in pregnancy that you feel your best. Often times, morning sickness has lessened or quit. You may have more energy, and you may get hungry more often. Your unborn baby (fetus) is growing rapidly. At the end of the sixth month, he or she is about 9 inches long and weighs about 1 pounds. You will likely feel the baby move (quickening) between 18 and 20 weeks of pregnancy. Follow these instructions at home:  Avoid all smoking, herbs, and alcohol. Avoid drugs not approved by your doctor.  Do not use any tobacco products, including cigarettes, chewing tobacco, and electronic cigarettes. If you need help quitting, ask your doctor. You may get counseling or other support to help you quit.  Only take medicine as told by your doctor. Some medicines are safe and some are not during pregnancy.  Exercise only as told by your doctor. Stop exercising if you start having cramps.  Eat regular, healthy meals.  Wear a good support bra if your breasts are tender.  Do not use hot tubs, steam rooms, or saunas.  Wear your seat belt when driving.  Avoid raw meat, uncooked cheese, and liter boxes and soil used by cats.  Take your prenatal vitamins.  Take 1500-2000 milligrams of calcium daily starting at the 20th week of pregnancy until you deliver your baby.  Try taking medicine that helps you poop (stool softener) as needed, and if your doctor approves. Eat more fiber by eating fresh fruit, vegetables, and whole grains. Drink enough fluids to keep your pee (urine) clear or pale yellow.  Take warm water baths (sitz baths) to soothe pain or discomfort caused by hemorrhoids. Use hemorrhoid cream if your doctor approves.  If you have puffy, bulging veins (varicose veins), wear support hose. Raise (elevate) your feet for 15 minutes, 3-4 times a day. Limit salt in your diet.  Avoid heavy  lifting, wear low heals, and sit up straight.  Rest with your legs raised if you have leg cramps or low back pain.  Visit your dentist if you have not gone during your pregnancy. Use a soft toothbrush to brush your teeth. Be gentle when you floss.  You can have sex (intercourse) unless your doctor tells you not to.  Go to your doctor visits. Get help if:  You feel dizzy.  You have mild cramps or pressure in your lower belly (abdomen).  You have a nagging pain in your belly area.  You continue to feel sick to your stomach (nauseous), throw up (vomit), or have watery poop (diarrhea).  You have bad smelling fluid coming from your vagina.  You have pain with peeing (urination). Get help right away if:  You have a fever.  You are leaking fluid from your vagina.  You have spotting or bleeding from your vagina.  You have severe belly cramping or pain.  You lose or gain weight rapidly.  You have trouble catching your breath and have chest pain.  You notice sudden or extreme puffiness (swelling) of your face, hands, ankles, feet, or legs.  You have not felt the baby move in over an hour.  You have severe headaches that do not go away with medicine.  You have vision changes. This information is not intended to replace advice given to you by your health care provider. Make sure you discuss any questions you have with your health care   provider. Document Released: 12/29/2009 Document Revised: 03/11/2016 Document Reviewed: 12/05/2012 Elsevier Interactive Patient Education  2017 Elsevier Inc.  

## 2018-04-19 ENCOUNTER — Ambulatory Visit (HOSPITAL_COMMUNITY)
Admission: RE | Admit: 2018-04-19 | Discharge: 2018-04-19 | Disposition: A | Discharge status: Home / Self Care | Payer: Medicaid Other | Source: Ambulatory Visit

## 2018-04-27 ENCOUNTER — Ambulatory Visit (HOSPITAL_COMMUNITY)
Admission: RE | Admit: 2018-04-27 | Discharge: 2018-04-27 | Disposition: A | Discharge status: Home / Self Care | Payer: Medicaid Other | Source: Ambulatory Visit | Attending: Obstetrics & Gynecology | Admitting: Obstetrics & Gynecology

## 2018-04-27 ENCOUNTER — Encounter (HOSPITAL_COMMUNITY): Payer: Self-pay

## 2018-05-04 ENCOUNTER — Encounter: Payer: Self-pay | Admitting: Family Medicine

## 2018-05-09 ENCOUNTER — Encounter: Payer: Self-pay | Admitting: Advanced Practice Midwife

## 2018-06-13 ENCOUNTER — Encounter: Payer: Self-pay | Admitting: Advanced Practice Midwife

## 2018-06-13 ENCOUNTER — Ambulatory Visit (INDEPENDENT_AMBULATORY_CARE_PROVIDER_SITE_OTHER): Payer: Medicaid Other | Admitting: Advanced Practice Midwife

## 2018-06-13 VITALS — BP 125/80 | HR 98 | Wt 167.0 lb

## 2018-06-13 DIAGNOSIS — O99320 Drug use complicating pregnancy, unspecified trimester: Secondary | ICD-10-CM

## 2018-06-13 DIAGNOSIS — Z87898 Personal history of other specified conditions: Secondary | ICD-10-CM | POA: Diagnosis not present

## 2018-06-13 DIAGNOSIS — F1991 Other psychoactive substance use, unspecified, in remission: Secondary | ICD-10-CM

## 2018-06-13 DIAGNOSIS — Z3400 Encounter for supervision of normal first pregnancy, unspecified trimester: Secondary | ICD-10-CM
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
## 2018-06-13 NOTE — Progress Notes (Deleted)
   PRENATAL VISIT NOTE  Subjective:  Shelia Anderson is a 27 y.o. G1P0 at 5675w0d being seen today for ongoing prenatal care.  She is currently monitored for the following issues for this {Blank single:19197::"high-risk","low-risk"} pregnancy and has Acute upper respiratory infection; Acute bronchitis; Substance abuse affecting pregnancy, antepartum; Supervision of normal first pregnancy, antepartum; Tobacco use affecting pregnancy, antepartum; and Nausea and vomiting of pregnancy, antepartum on their problem list.  Patient reports {sx:14538}.  Contractions: Not present. Vag. Bleeding: None.  Movement: Present. Denies leaking of fluid.   The following portions of the patient's history were reviewed and updated as appropriate: allergies, current medications, past family history, past medical history, past social history, past surgical history and problem list. Problem list updated.  Objective:   Vitals:   06/13/18 1036  BP: 125/80  Pulse: 98  Weight: 75.8 kg    Fetal Status: Fetal Heart Rate (bpm): 158   Movement: Present     General:  Alert, oriented and cooperative. Patient is in no acute distress.  Skin: Skin is warm and dry. No rash noted.   Cardiovascular: Normal heart rate noted  Respiratory: Normal respiratory effort, no problems with respiration noted  Abdomen: Soft, gravid, appropriate for gestational age.  Pain/Pressure: Present     Pelvic: {Blank single:19197::"Cervical exam performed","Cervical exam deferred"}        Extremities: Normal range of motion.  Edema: None  Mental Status: Normal mood and affect. Normal behavior. Normal judgment and thought content.   Assessment and Plan:  Pregnancy: G1P0 at 3875w0d  1. History of drug use *** - Urine drugs of abuse scrn w alc, routine (Ref Lab)  2. Substance abuse affecting pregnancy, antepartum ***  3. Supervision of normal first pregnancy, antepartum ***  {Blank single:19197::"Term","Preterm"} labor symptoms and  general obstetric precautions including but not limited to vaginal bleeding, contractions, leaking of fluid and fetal movement were reviewed in detail with the patient. Please refer to After Visit Summary for other counseling recommendations.  No follow-ups on file.  Future Appointments  Date Time Provider Department Center  06/27/2018  8:15 AM Aviva SignsWilliams, Taria Castrillo L, CNM [facility] None  06/27/2018 10:45 AM Aviva SignsWilliams, Makennah Omura L, CNM [facility] None    Wynelle BourgeoisMarie Shreeya Recendiz, CNM

## 2018-06-13 NOTE — Patient Instructions (Signed)

## 2018-06-14 LAB — URINE DRUGS OF ABUSE SCREEN W ALC, ROUTINE (REF LAB)
AMPHETAMINES, URINE: NEGATIVE ng/mL
BENZODIAZEPINE QUANT UR: NEGATIVE ng/mL
Barbiturate Quant, Ur: NEGATIVE ng/mL
CANNABINOID QUANT UR: NEGATIVE ng/mL
Cocaine (Metab.): NEGATIVE ng/mL
Ethanol, Urine: NEGATIVE %
Methadone Screen, Urine: NEGATIVE ng/mL
Opiate Quant, Ur: NEGATIVE ng/mL
PCP QUANT UR: NEGATIVE ng/mL
Propoxyphene: NEGATIVE ng/mL

## 2018-06-20 ENCOUNTER — Ambulatory Visit: Payer: Medicaid Other

## 2018-06-20 DIAGNOSIS — Z3400 Encounter for supervision of normal first pregnancy, unspecified trimester: Secondary | ICD-10-CM

## 2018-06-21 LAB — CBC
HEMATOCRIT: 30.4 % — AB (ref 34.0–46.6)
Hemoglobin: 10.4 g/dL — ABNORMAL LOW (ref 11.1–15.9)
MCH: 30.7 pg (ref 26.6–33.0)
MCHC: 34.2 g/dL (ref 31.5–35.7)
MCV: 90 fL (ref 79–97)
PLATELETS: 467 10*3/uL — AB (ref 150–450)
RBC: 3.39 x10E6/uL — AB (ref 3.77–5.28)
RDW: 13.2 % (ref 12.3–15.4)
WBC: 9.8 10*3/uL (ref 3.4–10.8)

## 2018-06-21 LAB — GLUCOSE TOLERANCE, 2 HOURS W/ 1HR
GLUCOSE, 2 HOUR: 84 mg/dL (ref 65–152)
Glucose, 1 hour: 140 mg/dL (ref 65–179)
Glucose, Fasting: 115 mg/dL — ABNORMAL HIGH (ref 65–91)

## 2018-06-21 LAB — HIV ANTIBODY (ROUTINE TESTING W REFLEX): HIV SCREEN 4TH GENERATION: NONREACTIVE

## 2018-06-21 LAB — RPR: RPR Ser Ql: NONREACTIVE

## 2018-06-26 ENCOUNTER — Telehealth: Payer: Self-pay

## 2018-06-26 ENCOUNTER — Encounter: Payer: Self-pay | Admitting: Advanced Practice Midwife

## 2018-06-26 DIAGNOSIS — O24419 Gestational diabetes mellitus in pregnancy, unspecified control: Secondary | ICD-10-CM | POA: Insufficient documentation

## 2018-06-26 DIAGNOSIS — O99810 Abnormal glucose complicating pregnancy: Secondary | ICD-10-CM

## 2018-06-26 IMAGING — US US MFM OB DETAIL+14 WK
1 series · 14 of 28 positions shown · non-contrast
Comparison: none

[Series 1: us mfm ob detail+14 wk · 14 of 103 slices shown]
[im 4/103]
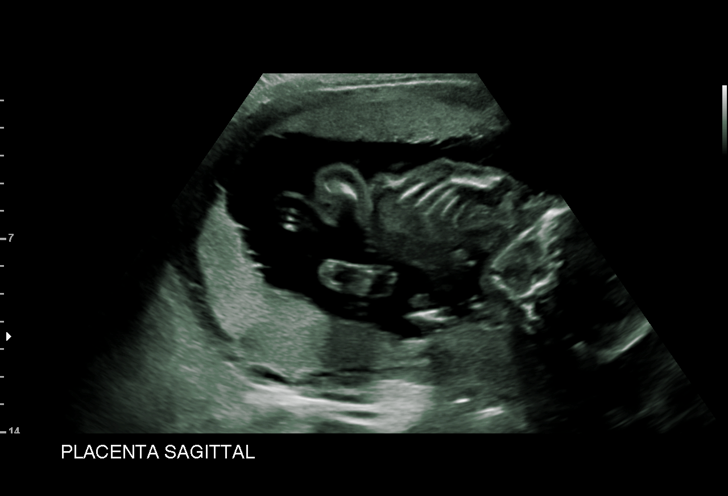
[im 12/103]
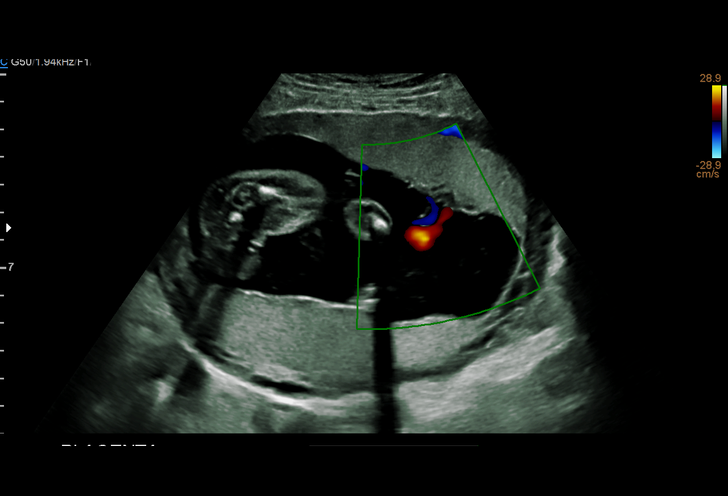
[im 19/103]
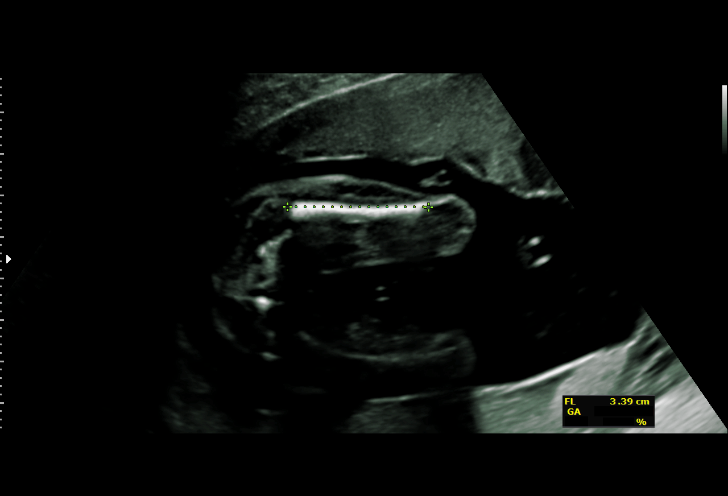
[im 27/103]
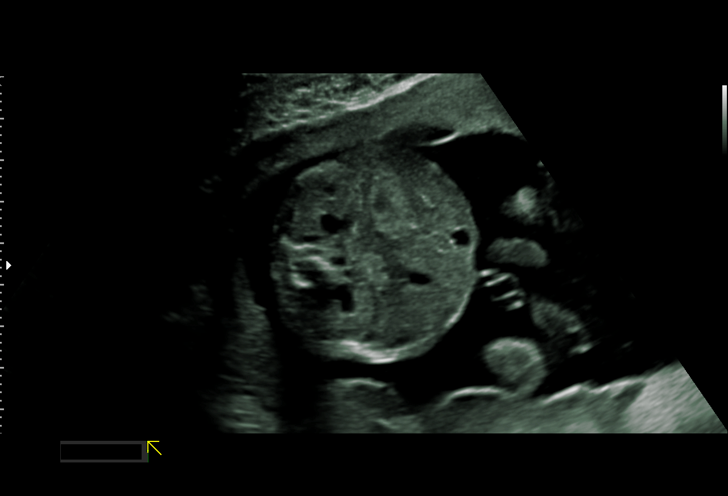
[im 35/103]
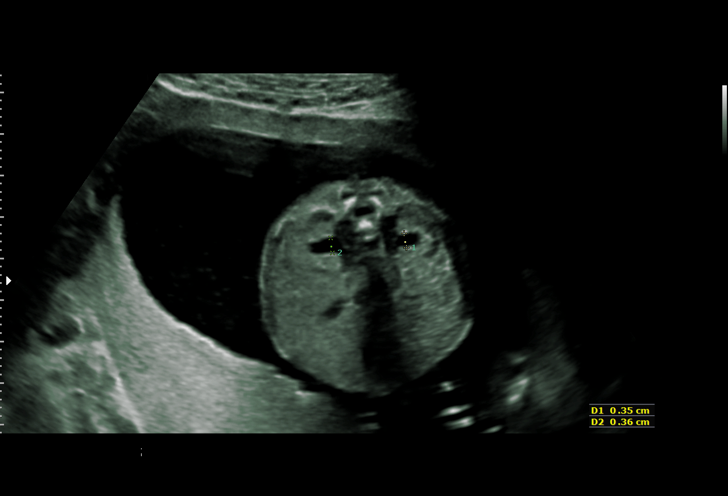
[im 42/103]
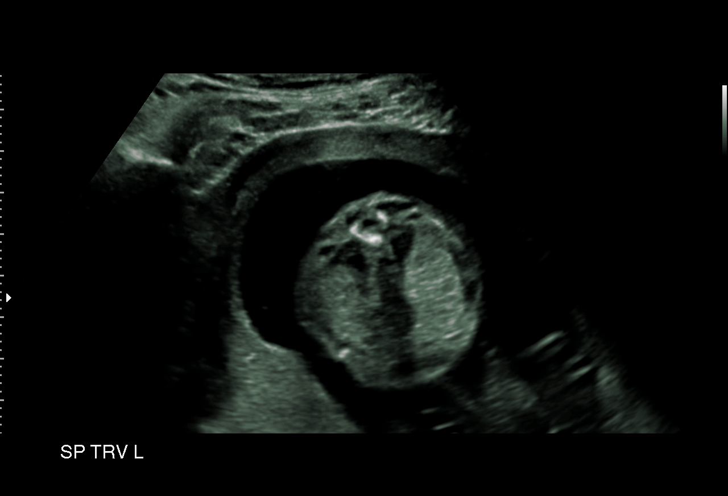
[im 50/103]
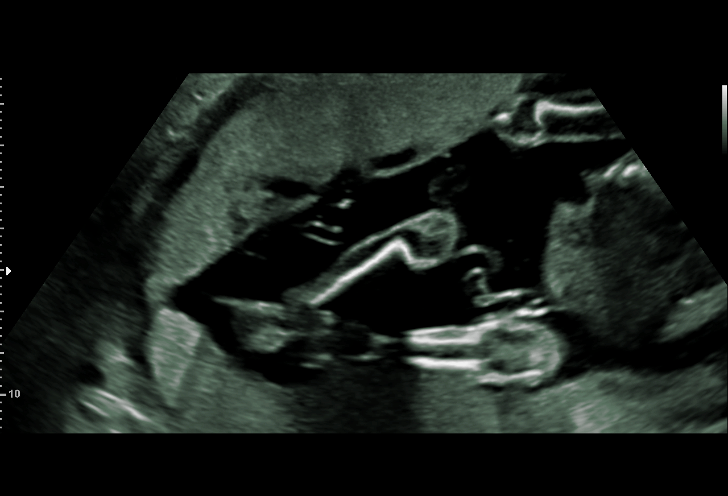
[im 57/103]
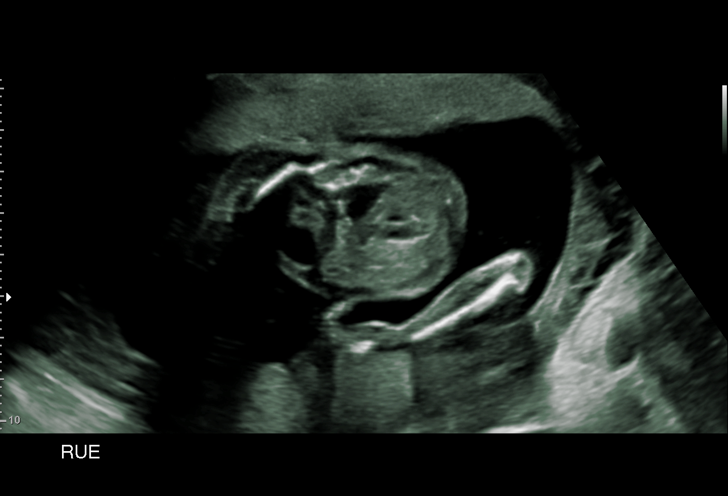
[im 65/103]
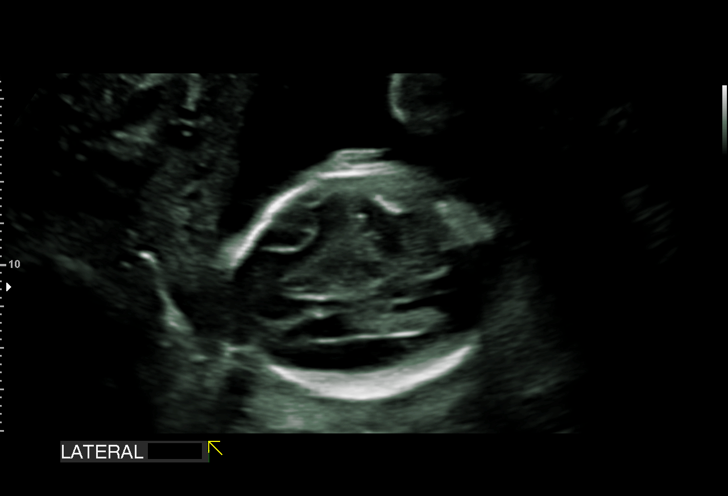
[im 72/103]
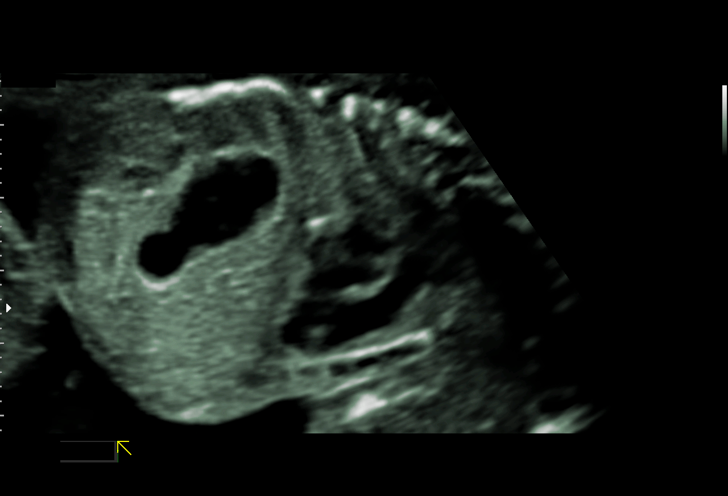
[im 80/103]
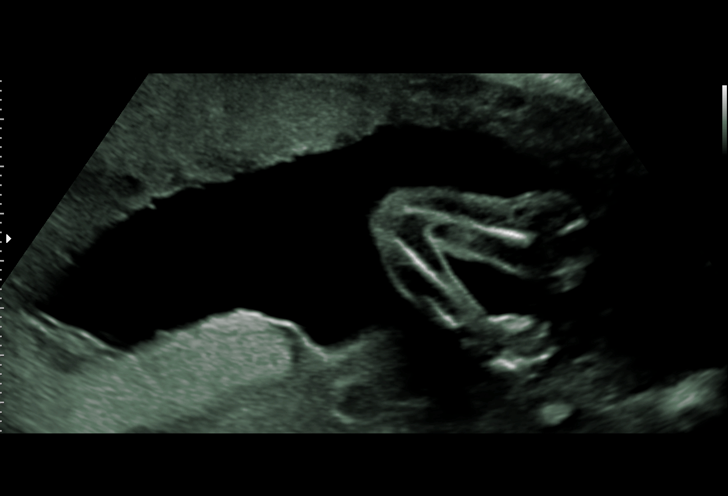
[im 87/103]
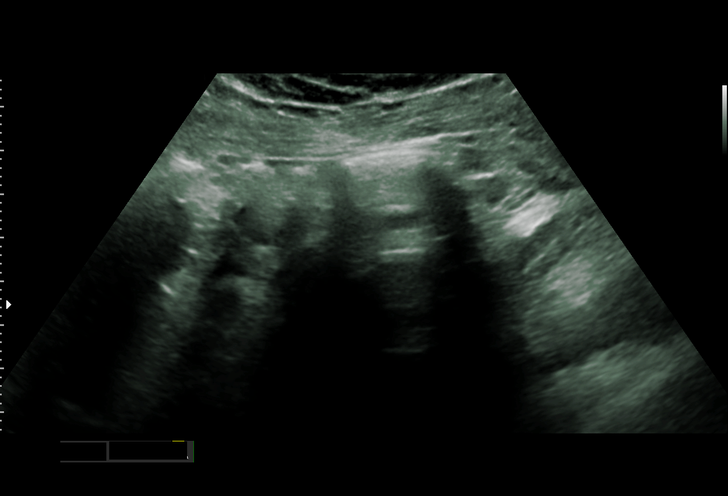
[im 95/103]
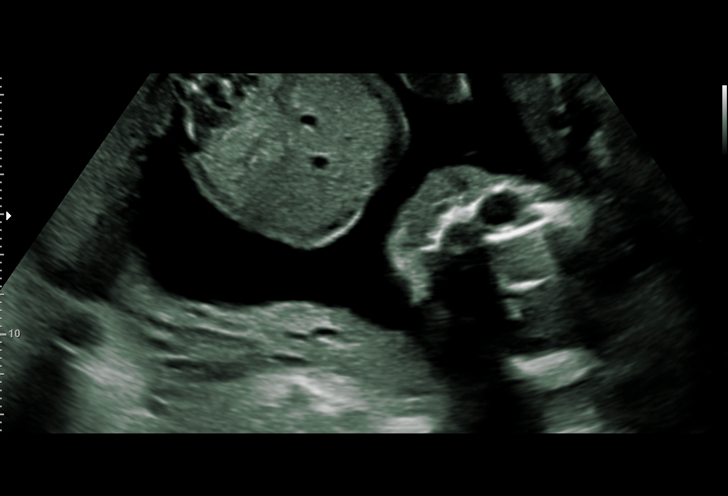
[im 103/103]
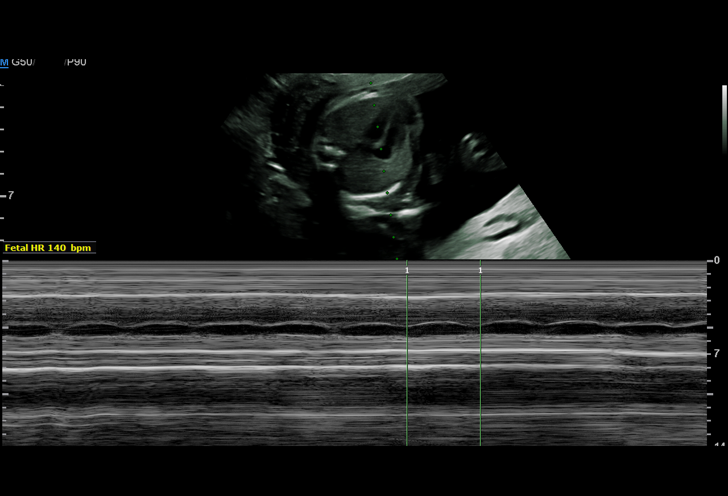

[14 of 28 positions shown; findings below may reference images not displayed]

Name:       QUIRIJN AMAZIGH              Visit Date: 03/22/2018 [DATE]

MICHAEL KWAME

FRANCISKUS
Indications

20 weeks gestation of pregnancy
Encounter for antenatal screening for
malformations
Chronic Hepatitis C complicating pregnancy,
antepartum
Substance abuse affecting pregnancy,
antepartum
OB History

Gravidity:    1
Fetal Evaluation

Num Of Fetuses:     1
Fetal Heart         153
Rate(bpm):
Cardiac Activity:   Observed
Presentation:       Cephalic
Placenta:           Anterior POST, above cervical os
P. Cord Insertion:  Visualized, central

Amniotic Fluid
AFI FV:      Subjectively within normal limits

Largest Pocket(cm)
5.7
Biometry

BPD:      53.7  mm     G. Age:  22w 2d         99  %    CI:        73.06   %    70 - 86
FL/HC:      16.8   %    16.8 -
HC:      199.7  mm     G. Age:  22w 1d       > 97  %    HC/AC:      1.20        1.09 -
AC:      166.7  mm     G. Age:  21w 5d         88  %    FL/BPD:     62.4   %
FL:       33.5  mm     G. Age:  20w 4d         55  %    FL/AC:      20.1   %    20 - 24
HUM:      35.6  mm     G. Age:  22w 3d       > 95  %
CER:      22.3  mm     G. Age:  21w 1d         67  %
LV:        6.1  mm

Est. FW:     413  gm    0 lb 15 oz      64  %
Gestational Age

LMP:           20w 1d        Date:  11/01/17                 EDD:   08/08/18
U/S Today:     21w 5d                                        EDD:   07/28/18
Best:          20w 1d     Det. By:  LMP  (11/01/17)          EDD:   08/08/18
Anatomy

Cranium:               Appears normal         Aortic Arch:            Appears normal
Cavum:                 Not well visualized    Ductal Arch:            Not well visualized
Ventricles:            Appears normal         Diaphragm:              Appears normal
Choroid Plexus:        Appears normal         Stomach:                Appears normal, left
sided
Cerebellum:            Appears normal         Abdomen:                Appears normal
Posterior Fossa:       Appears normal         Abdominal Wall:         Appears nml (cord
insert, abd wall)
Nuchal Fold:           Not well visualized    Cord Vessels:           Appears normal (3
vessel cord)
Face:                  Not well visualized    Kidneys:                Appear normal
Lips:                  Not well visualized    Bladder:                Appears normal
Thoracic:              Appears normal         Spine:                  Appears normal
Heart:                 Appears normal         Upper Extremities:      Appears normal
(4CH, axis, and
situs)
RVOT:                  Appears normal         Lower Extremities:      Appears normal
LVOT:                  Appears normal

Other:  Technically difficult due to fetal position. Heels visualized. Female
gender.
Cervix Uterus Adnexa

Cervix
Length:              4  cm.
Normal appearance by transabdominal scan.

Left Ovary
Not visualized.

Right Ovary
Within normal limits.
Impression

Single living intrauterine pregnancy at 20w 1d.
Placenta Anterior, posterior, above cervical os.
Appropriate fetal growth.
Normal amniotic fluid volume.
The fetal anatomic survey is not complete.
No gross fetal anomalies identified.
The cervix measures 4cm transabdominally without funneling.
The adnexa appear normal bilaterally without masses.
Recommendations

Recommend follow-up ultrasound examination in 4 weeks for
reassessment of fetal growth and anatomy.

## 2018-06-26 NOTE — Telephone Encounter (Signed)
Left message for patient to return call to office. Jennifer Howard  RN 

## 2018-06-27 ENCOUNTER — Encounter: Payer: Self-pay | Admitting: Advanced Practice Midwife

## 2018-06-27 ENCOUNTER — Encounter: Payer: Medicaid Other | Attending: Obstetrics & Gynecology | Admitting: Registered"

## 2018-06-27 ENCOUNTER — Ambulatory Visit (INDEPENDENT_AMBULATORY_CARE_PROVIDER_SITE_OTHER): Payer: Medicaid Other | Admitting: Advanced Practice Midwife

## 2018-06-27 ENCOUNTER — Encounter: Payer: Self-pay | Admitting: Registered"

## 2018-06-27 ENCOUNTER — Telehealth: Payer: Self-pay

## 2018-06-27 VITALS — BP 119/83 | HR 104 | Wt 169.0 lb

## 2018-06-27 DIAGNOSIS — O99810 Abnormal glucose complicating pregnancy: Secondary | ICD-10-CM | POA: Diagnosis not present

## 2018-06-27 DIAGNOSIS — B1920 Unspecified viral hepatitis C without hepatic coma: Secondary | ICD-10-CM

## 2018-06-27 DIAGNOSIS — O26893 Other specified pregnancy related conditions, third trimester: Secondary | ICD-10-CM

## 2018-06-27 DIAGNOSIS — O24410 Gestational diabetes mellitus in pregnancy, diet controlled: Secondary | ICD-10-CM | POA: Diagnosis not present

## 2018-06-27 DIAGNOSIS — O26899 Other specified pregnancy related conditions, unspecified trimester: Secondary | ICD-10-CM

## 2018-06-27 DIAGNOSIS — Z3A00 Weeks of gestation of pregnancy not specified: Secondary | ICD-10-CM | POA: Diagnosis not present

## 2018-06-27 DIAGNOSIS — Z713 Dietary counseling and surveillance: Secondary | ICD-10-CM | POA: Diagnosis present

## 2018-06-27 DIAGNOSIS — F1910 Other psychoactive substance abuse, uncomplicated: Secondary | ICD-10-CM

## 2018-06-27 DIAGNOSIS — Z87898 Personal history of other specified conditions: Secondary | ICD-10-CM

## 2018-06-27 DIAGNOSIS — Z3403 Encounter for supervision of normal first pregnancy, third trimester: Secondary | ICD-10-CM

## 2018-06-27 DIAGNOSIS — O99320 Drug use complicating pregnancy, unspecified trimester: Secondary | ICD-10-CM

## 2018-06-27 DIAGNOSIS — F1991 Other psychoactive substance use, unspecified, in remission: Secondary | ICD-10-CM

## 2018-06-27 DIAGNOSIS — O99323 Drug use complicating pregnancy, third trimester: Secondary | ICD-10-CM | POA: Diagnosis not present

## 2018-06-27 DIAGNOSIS — Z3400 Encounter for supervision of normal first pregnancy, unspecified trimester: Secondary | ICD-10-CM

## 2018-06-27 DIAGNOSIS — Z6791 Unspecified blood type, Rh negative: Secondary | ICD-10-CM

## 2018-06-27 MED ORDER — GLUCOSE BLOOD VI STRP: ORAL_STRIP | 12 refills | Status: DC

## 2018-06-27 MED ORDER — ACCU-CHEK FASTCLIX LANCETS MISC: 1.0000 | Freq: Four times a day (QID) | 12 refills | Status: DC

## 2018-06-27 NOTE — Progress Notes (Signed)
Patient was seen on 06/27/18 for Gestational Diabetes self-management education at the [facility]. The following learning objectives were met by the patient during this course:   States the definition of Gestational Diabetes  States why dietary management is important in controlling blood glucose  Describes the effects each nutrient has on blood glucose levels  Demonstrates ability to create a balanced meal plan  Demonstrates carbohydrate counting   States when to check blood glucose levels  Demonstrates proper blood glucose monitoring techniques  States the effect of stress and exercise on blood glucose levels  States the importance of limiting caffeine and abstaining from alcohol and smoking  Blood glucose monitor given: Accu-Chek Guide Lot # [id number] Exp: 02/25/19 Blood glucose reading: 84 mg/dL Patient had not eaten in ~7 hrs, drinking 16 oz Mt. Dew  Patient instructed to monitor glucose levels: FBS: 60 - <95; 1 hour: <140; 2 hour: <120  Patient received handouts:  Nutrition Diabetes and Pregnancy, including carb counting list  Patient will be seen for follow-up as needed.

## 2018-06-27 NOTE — Telephone Encounter (Signed)
Patient needs glucose testing supplies sent to pharmacy. Armandina Stammer RN

## 2018-06-28 ENCOUNTER — Encounter: Payer: Self-pay | Admitting: Advanced Practice Midwife

## 2018-06-28 DIAGNOSIS — Z6791 Unspecified blood type, Rh negative: Secondary | ICD-10-CM | POA: Insufficient documentation

## 2018-06-28 DIAGNOSIS — O26899 Other specified pregnancy related conditions, unspecified trimester: Secondary | ICD-10-CM

## 2018-06-28 DIAGNOSIS — B1920 Unspecified viral hepatitis C without hepatic coma: Secondary | ICD-10-CM | POA: Insufficient documentation

## 2018-06-28 LAB — COMPREHENSIVE METABOLIC PANEL
A/G RATIO: 1.2 (ref 1.2–2.2)
ALT: 86 IU/L — AB (ref 0–32)
AST: 63 IU/L — ABNORMAL HIGH (ref 0–40)
Albumin: 3.6 g/dL (ref 3.5–5.5)
Alkaline Phosphatase: 233 IU/L — ABNORMAL HIGH (ref 39–117)
BUN/Creatinine Ratio: 13 (ref 9–23)
BUN: 6 mg/dL (ref 6–20)
Bilirubin Total: 0.5 mg/dL (ref 0.0–1.2)
CALCIUM: 9.3 mg/dL (ref 8.7–10.2)
CO2: 16 mmol/L — ABNORMAL LOW (ref 20–29)
CREATININE: 0.48 mg/dL — AB (ref 0.57–1.00)
Chloride: 103 mmol/L (ref 96–106)
GFR calc non Af Amer: 134 mL/min/{1.73_m2} (ref >59)
GFR, EST AFRICAN AMERICAN: 154 mL/min/{1.73_m2} (ref >59)
GLOBULIN, TOTAL: 3.1 g/dL (ref 1.5–4.5)
Glucose: 115 mg/dL — ABNORMAL HIGH (ref 65–99)
POTASSIUM: 4.2 mmol/L (ref 3.5–5.2)
SODIUM: 138 mmol/L (ref 134–144)
TOTAL PROTEIN: 6.7 g/dL (ref 6.0–8.5)

## 2018-06-28 NOTE — Patient Instructions (Signed)

## 2018-06-28 NOTE — Progress Notes (Signed)
   PRENATAL VISIT NOTE  Subjective:  Shelia Anderson is a 27 y.o. G1P0 at [redacted]w[redacted]d being seen today for ongoing prenatal care.  She is currently monitored for the following issues for this high-risk pregnancy and has Substance abuse affecting pregnancy, antepartum; Supervision of normal first pregnancy, antepartum; Tobacco use affecting pregnancy, antepartum; Gestational diabetes; Abnormal glucose tolerance test (GTT) during pregnancy, antepartum; and Hepatitis C on their problem list.  Patient reports no complaints.  Contractions: Irritability. Vag. Bleeding: Large.  Movement: Present. Denies leaking of fluid.   The following portions of the patient's history were reviewed and updated as appropriate: allergies, current medications, past family history, past medical history, past social history, past surgical history and problem list. Problem list updated.  Objective:   Vitals:   06/27/18 1058  BP: 119/83  Pulse: (!) 104  Weight: 76.7 kg    Fetal Status: Fetal Heart Rate (bpm): 148   Movement: Present  Presentation: Vertex  General:  Alert, oriented and cooperative. Patient is in no acute distress.  Skin: Skin is warm and dry. No rash noted.   Cardiovascular: Normal heart rate noted  Respiratory: Normal respiratory effort, no problems with respiration noted  Abdomen: Soft, gravid, appropriate for gestational age.  Pain/Pressure: Present     Pelvic: Cervical exam deferred Dilation: 1 Effacement (%): 50 Station: -3  Extremities: Normal range of motion.  Edema: Trace  Mental Status: Normal mood and affect. Normal behavior. Normal judgment and thought content.   Assessment and Plan:  Pregnancy: G1P0 at [redacted]w[redacted]d  1. Supervision of normal first pregnancy, antepartum        Added CMET to check on LFTs due to Hep C (not run with glucola blood)  - [place] MFM OB FOLLOW UP; Future - Comprehensive metabolic panel  2. Substance abuse affecting pregnancy, antepartum      Last screen negative - [place]  MFM OB FOLLOW UP; Future - Comprehensive metabolic panel  3. History of drug use     Clean - Comprehensive metabolic panel  4. Abnormal glucose tolerance test (GTT) during pregnancy, antepartum      Informed of diagnosis (unable to reach by phone).  Scheduled for teaching and meter       Discussed treatment and monitoring        First NST today, reactive.   BPP scheduled - Comprehensive metabolic panel  5. Hepatitis C virus infection without hepatic coma, unspecified chronicity      Consult Dr Erin Fulling as to need for titers.  Unable to see original diagnosis - Comprehensive metabolic panel  6. Diet controlled gestational diabetes mellitus (GDM) in third trimester     Scheduled for teaching      Briefly reviewed diet  7.   Rh Neg      Got Rhophylac in March, but don't see it was given at 28 wks (missed visits between 22-32wk )       Will give this week  Preterm labor symptoms and general obstetric precautions including but not limited to vaginal bleeding, contractions, leaking of fluid and fetal movement were reviewed in detail with the patient. Please refer to After Visit Summary for other counseling recommendations.  Return in about 1 week (around 07/04/2018) for [facility].  Future Appointments  Date Time Provider Department Center  07/07/2018 10:15 AM Levie Heritage, DO [facility] None  07/07/2018  3:15 PM [facility] [place] 4 [facility]    Wynelle Bourgeois, CNM

## 2018-06-29 ENCOUNTER — Telehealth: Payer: Self-pay

## 2018-06-29 NOTE — Telephone Encounter (Signed)
Patient called and made aware that she needs to have Rhogam.   Patient states she could get it at her next appointment on Friday Sept 20th. Informed patient if she could come by the office before then for a quick nurse visit for the injection to call us and set up a nurse visit. Patient states understanding. Shelia StammerJennifer Howard RN

## 2018-06-30 LAB — SPECIMEN STATUS REPORT

## 2018-06-30 LAB — HEPATITIS B SURFACE ANTIGEN: Hepatitis B Surface Ag: NEGATIVE

## 2018-07-06 ENCOUNTER — Encounter: Payer: Self-pay | Admitting: Advanced Practice Midwife

## 2018-07-06 DIAGNOSIS — T7491XA Unspecified adult maltreatment, confirmed, initial encounter: Secondary | ICD-10-CM | POA: Insufficient documentation

## 2018-07-07 ENCOUNTER — Ambulatory Visit (INDEPENDENT_AMBULATORY_CARE_PROVIDER_SITE_OTHER): Payer: Medicaid Other | Admitting: Family Medicine

## 2018-07-07 ENCOUNTER — Other Ambulatory Visit: Payer: Self-pay | Admitting: Advanced Practice Midwife

## 2018-07-07 ENCOUNTER — Other Ambulatory Visit: Payer: Self-pay

## 2018-07-07 ENCOUNTER — Ambulatory Visit (HOSPITAL_COMMUNITY)
Admission: RE | Admit: 2018-07-07 | Discharge: 2018-07-07 | Disposition: A | Discharge status: Home / Self Care | Payer: Medicaid Other | Source: Ambulatory Visit | Attending: Advanced Practice Midwife | Admitting: Advanced Practice Midwife

## 2018-07-07 ENCOUNTER — Encounter (HOSPITAL_COMMUNITY): Payer: Self-pay

## 2018-07-07 VITALS — BP 118/81 | HR 94 | Wt 173.1 lb

## 2018-07-07 DIAGNOSIS — O99323 Drug use complicating pregnancy, third trimester: Secondary | ICD-10-CM | POA: Insufficient documentation

## 2018-07-07 DIAGNOSIS — Z3400 Encounter for supervision of normal first pregnancy, unspecified trimester: Secondary | ICD-10-CM

## 2018-07-07 DIAGNOSIS — O24410 Gestational diabetes mellitus in pregnancy, diet controlled: Secondary | ICD-10-CM

## 2018-07-07 DIAGNOSIS — B182 Chronic viral hepatitis C: Secondary | ICD-10-CM | POA: Diagnosis not present

## 2018-07-07 DIAGNOSIS — O0933 Supervision of pregnancy with insufficient antenatal care, third trimester: Secondary | ICD-10-CM | POA: Insufficient documentation

## 2018-07-07 DIAGNOSIS — Z3A35 35 weeks gestation of pregnancy: Secondary | ICD-10-CM | POA: Insufficient documentation

## 2018-07-07 DIAGNOSIS — O26899 Other specified pregnancy related conditions, unspecified trimester: Secondary | ICD-10-CM

## 2018-07-07 DIAGNOSIS — Z6791 Unspecified blood type, Rh negative: Secondary | ICD-10-CM | POA: Diagnosis not present

## 2018-07-07 DIAGNOSIS — O99320 Drug use complicating pregnancy, unspecified trimester: Secondary | ICD-10-CM

## 2018-07-07 DIAGNOSIS — F1991 Other psychoactive substance use, unspecified, in remission: Secondary | ICD-10-CM

## 2018-07-07 DIAGNOSIS — F1910 Other psychoactive substance abuse, uncomplicated: Secondary | ICD-10-CM

## 2018-07-07 DIAGNOSIS — O98413 Viral hepatitis complicating pregnancy, third trimester: Secondary | ICD-10-CM | POA: Insufficient documentation

## 2018-07-07 DIAGNOSIS — B1920 Unspecified viral hepatitis C without hepatic coma: Secondary | ICD-10-CM

## 2018-07-07 DIAGNOSIS — O26893 Other specified pregnancy related conditions, third trimester: Secondary | ICD-10-CM

## 2018-07-07 DIAGNOSIS — Z87898 Personal history of other specified conditions: Secondary | ICD-10-CM

## 2018-07-07 LAB — POCT URINALYSIS DIPSTICK OB
Bilirubin, UA: NEGATIVE
Blood, UA: NEGATIVE
GLUCOSE, UA: NEGATIVE
Ketones, UA: NEGATIVE
NITRITE UA: NEGATIVE
PROTEIN: NEGATIVE

## 2018-07-07 MED ORDER — GLUCOSE BLOOD VI STRP: ORAL_STRIP | 12 refills | Status: DC

## 2018-07-07 MED ORDER — PRENATAL MULTIVITAMIN CH: 1.0000 | ORAL_TABLET | Freq: Every day | ORAL | 3 refills | Status: DC

## 2018-07-07 MED ORDER — ACCU-CHEK FASTCLIX LANCETS MISC: 1.0000 | Freq: Four times a day (QID) | 12 refills | Status: DC

## 2018-07-07 MED ORDER — RHO D IMMUNE GLOBULIN 1500 UNIT/2ML IJ SOSY
300.0000 ug | PREFILLED_SYRINGE | Freq: Once | INTRAMUSCULAR | Status: AC
  Administered 2018-07-07: 300 ug via INTRAMUSCULAR
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
## 2018-07-10 ENCOUNTER — Other Ambulatory Visit (HOSPITAL_COMMUNITY): Payer: Self-pay | Admitting: *Deleted

## 2018-07-10 ENCOUNTER — Encounter: Payer: Self-pay | Admitting: Advanced Practice Midwife

## 2018-07-10 DIAGNOSIS — O3660X0 Maternal care for excessive fetal growth, unspecified trimester, not applicable or unspecified: Secondary | ICD-10-CM | POA: Insufficient documentation

## 2018-07-10 DIAGNOSIS — O24410 Gestational diabetes mellitus in pregnancy, diet controlled: Secondary | ICD-10-CM

## 2018-07-14 ENCOUNTER — Ambulatory Visit (HOSPITAL_BASED_OUTPATIENT_CLINIC_OR_DEPARTMENT_OTHER)
Admission: RE | Admit: 2018-07-14 | Discharge: 2018-07-14 | Disposition: A | Discharge status: Home / Self Care | Payer: Medicaid Other | Source: Ambulatory Visit | Attending: Advanced Practice Midwife | Admitting: Advanced Practice Midwife

## 2018-07-14 ENCOUNTER — Encounter (HOSPITAL_COMMUNITY): Payer: Self-pay

## 2018-07-14 ENCOUNTER — Other Ambulatory Visit (HOSPITAL_COMMUNITY)
Admission: RE | Admit: 2018-07-14 | Discharge: 2018-07-14 | Disposition: A | Discharge status: Home / Self Care | Payer: Medicaid Other | Source: Ambulatory Visit | Attending: Family Medicine | Admitting: Family Medicine

## 2018-07-14 ENCOUNTER — Ambulatory Visit (INDEPENDENT_AMBULATORY_CARE_PROVIDER_SITE_OTHER): Payer: Medicaid Other | Admitting: Family Medicine

## 2018-07-14 VITALS — BP 135/85 | HR 83 | Wt 175.0 lb

## 2018-07-14 DIAGNOSIS — O24410 Gestational diabetes mellitus in pregnancy, diet controlled: Secondary | ICD-10-CM | POA: Insufficient documentation

## 2018-07-14 DIAGNOSIS — Z3A00 Weeks of gestation of pregnancy not specified: Secondary | ICD-10-CM | POA: Diagnosis not present

## 2018-07-14 DIAGNOSIS — O99323 Drug use complicating pregnancy, third trimester: Secondary | ICD-10-CM

## 2018-07-14 DIAGNOSIS — B1920 Unspecified viral hepatitis C without hepatic coma: Secondary | ICD-10-CM

## 2018-07-14 DIAGNOSIS — O99333 Smoking (tobacco) complicating pregnancy, third trimester: Secondary | ICD-10-CM | POA: Insufficient documentation

## 2018-07-14 DIAGNOSIS — Z87898 Personal history of other specified conditions: Secondary | ICD-10-CM | POA: Insufficient documentation

## 2018-07-14 DIAGNOSIS — Z3A36 36 weeks gestation of pregnancy: Secondary | ICD-10-CM

## 2018-07-14 DIAGNOSIS — O26899 Other specified pregnancy related conditions, unspecified trimester: Secondary | ICD-10-CM

## 2018-07-14 DIAGNOSIS — O0933 Supervision of pregnancy with insufficient antenatal care, third trimester: Secondary | ICD-10-CM | POA: Insufficient documentation

## 2018-07-14 DIAGNOSIS — F1910 Other psychoactive substance abuse, uncomplicated: Secondary | ICD-10-CM

## 2018-07-14 DIAGNOSIS — Z6791 Unspecified blood type, Rh negative: Secondary | ICD-10-CM | POA: Insufficient documentation

## 2018-07-14 DIAGNOSIS — B182 Chronic viral hepatitis C: Secondary | ICD-10-CM

## 2018-07-14 DIAGNOSIS — Z3403 Encounter for supervision of normal first pregnancy, third trimester: Secondary | ICD-10-CM

## 2018-07-14 DIAGNOSIS — O26893 Other specified pregnancy related conditions, third trimester: Secondary | ICD-10-CM

## 2018-07-14 DIAGNOSIS — F1991 Other psychoactive substance use, unspecified, in remission: Secondary | ICD-10-CM

## 2018-07-14 DIAGNOSIS — O98413 Viral hepatitis complicating pregnancy, third trimester: Secondary | ICD-10-CM

## 2018-07-14 DIAGNOSIS — Z3400 Encounter for supervision of normal first pregnancy, unspecified trimester: Secondary | ICD-10-CM

## 2018-07-14 DIAGNOSIS — F17200 Nicotine dependence, unspecified, uncomplicated: Secondary | ICD-10-CM
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
## 2018-07-14 NOTE — Progress Notes (Signed)
Pt states only having the scant bleeding after intercourse.

## 2018-07-15 ENCOUNTER — Inpatient Hospital Stay (HOSPITAL_COMMUNITY): Payer: Medicaid Other | Admitting: Anesthesiology

## 2018-07-15 ENCOUNTER — Encounter (HOSPITAL_COMMUNITY): Payer: Self-pay | Admitting: *Deleted

## 2018-07-15 ENCOUNTER — Other Ambulatory Visit: Payer: Self-pay

## 2018-07-15 ENCOUNTER — Inpatient Hospital Stay (HOSPITAL_COMMUNITY)
Admission: AD | Admit: 2018-07-15 | Discharge: 2018-07-17 | DRG: 805 | Disposition: A | Discharge status: Home / Self Care | Payer: Medicaid Other | Attending: Family Medicine | Admitting: Family Medicine

## 2018-07-15 DIAGNOSIS — F17210 Nicotine dependence, cigarettes, uncomplicated: Secondary | ICD-10-CM | POA: Diagnosis present

## 2018-07-15 DIAGNOSIS — Z23 Encounter for immunization: Secondary | ICD-10-CM

## 2018-07-15 DIAGNOSIS — Z880 Allergy status to penicillin: Secondary | ICD-10-CM | POA: Diagnosis not present

## 2018-07-15 DIAGNOSIS — O99324 Drug use complicating childbirth: Secondary | ICD-10-CM | POA: Diagnosis present

## 2018-07-15 DIAGNOSIS — F1410 Cocaine abuse, uncomplicated: Secondary | ICD-10-CM | POA: Diagnosis present

## 2018-07-15 DIAGNOSIS — O42913 Preterm premature rupture of membranes, unspecified as to length of time between rupture and onset of labor, third trimester: Secondary | ICD-10-CM | POA: Diagnosis present

## 2018-07-15 DIAGNOSIS — O24420 Gestational diabetes mellitus in childbirth, diet controlled: Secondary | ICD-10-CM | POA: Diagnosis present

## 2018-07-15 DIAGNOSIS — O24410 Gestational diabetes mellitus in pregnancy, diet controlled: Secondary | ICD-10-CM

## 2018-07-15 DIAGNOSIS — Z3A36 36 weeks gestation of pregnancy: Secondary | ICD-10-CM

## 2018-07-15 DIAGNOSIS — O99334 Smoking (tobacco) complicating childbirth: Secondary | ICD-10-CM | POA: Diagnosis present

## 2018-07-15 DIAGNOSIS — O4290 Premature rupture of membranes, unspecified as to length of time between rupture and onset of labor, unspecified weeks of gestation: Secondary | ICD-10-CM | POA: Diagnosis present

## 2018-07-15 DIAGNOSIS — O42013 Preterm premature rupture of membranes, onset of labor within 24 hours of rupture, third trimester: Secondary | ICD-10-CM

## 2018-07-15 LAB — COMPREHENSIVE METABOLIC PANEL
ALK PHOS: 239 U/L — AB (ref 38–126)
ALT: 42 U/L (ref 0–44)
ANION GAP: 11 (ref 5–15)
AST: 33 U/L (ref 15–41)
Albumin: 2.6 g/dL — ABNORMAL LOW (ref 3.5–5.0)
BILIRUBIN TOTAL: 0.7 mg/dL (ref 0.3–1.2)
BUN: 9 mg/dL (ref 6–20)
CALCIUM: 8.7 mg/dL — AB (ref 8.9–10.3)
CO2: 17 mmol/L — ABNORMAL LOW (ref 22–32)
CREATININE: 0.45 mg/dL (ref 0.44–1.00)
Chloride: 106 mmol/L (ref 98–111)
Glucose, Bld: 78 mg/dL (ref 70–99)
Potassium: 4.1 mmol/L (ref 3.5–5.1)
Sodium: 134 mmol/L — ABNORMAL LOW (ref 135–145)
TOTAL PROTEIN: 6.3 g/dL — AB (ref 6.5–8.1)

## 2018-07-15 LAB — CBC
HEMATOCRIT: 33 % — AB (ref 36.0–46.0)
HEMOGLOBIN: 11 g/dL — AB (ref 12.0–15.0)
MCH: 30.2 pg (ref 26.0–34.0)
MCHC: 33.3 g/dL (ref 30.0–36.0)
MCV: 90.7 fL (ref 78.0–100.0)
Platelets: 400 10*3/uL (ref 150–400)
RBC: 3.64 MIL/uL — ABNORMAL LOW (ref 3.87–5.11)
RDW: 14.4 % (ref 11.5–15.5)
WBC: 9.7 10*3/uL (ref 4.0–10.5)

## 2018-07-15 LAB — PROTEIN / CREATININE RATIO, URINE: CREATININE, URINE: 39 mg/dL

## 2018-07-15 LAB — RAPID URINE DRUG SCREEN, HOSP PERFORMED
Amphetamines: NOT DETECTED
BARBITURATES: NOT DETECTED
Benzodiazepines: NOT DETECTED
COCAINE: NOT DETECTED
Opiates: NOT DETECTED
TETRAHYDROCANNABINOL: NOT DETECTED

## 2018-07-15 LAB — POCT FERN TEST: POCT Fern Test: POSITIVE

## 2018-07-15 LAB — GROUP B STREP BY PCR: Group B strep by PCR: NEGATIVE

## 2018-07-15 LAB — GLUCOSE, CAPILLARY: GLUCOSE-CAPILLARY: 82 mg/dL (ref 70–99)

## 2018-07-15 MED ORDER — LACTATED RINGERS IV SOLN: 500.0000 mL | INTRAVENOUS | Status: DC | PRN

## 2018-07-15 MED ORDER — FENTANYL 2.5 MCG/ML BUPIVACAINE 1/10 % EPIDURAL INFUSION (WH - ANES)
INTRAMUSCULAR | Status: AC
  Filled 2018-07-15: qty 100

## 2018-07-15 MED ORDER — IBUPROFEN 600 MG PO TABS
600.0000 mg | ORAL_TABLET | Freq: Four times a day (QID) | ORAL | Status: DC
  Administered 2018-07-15 – 2018-07-17 (×7): 600 mg via ORAL
  Filled 2018-07-15 (×7): qty 1

## 2018-07-15 MED ORDER — METHYLERGONOVINE MALEATE 0.2 MG/ML IJ SOLN
INTRAMUSCULAR | Status: AC
  Filled 2018-07-15: qty 1

## 2018-07-15 MED ORDER — SODIUM CHLORIDE 0.9 % IV SOLN
2.0000 g | Freq: Once | INTRAVENOUS | Status: AC
  Administered 2018-07-15: 2 g via INTRAVENOUS
  Filled 2018-07-15: qty 2

## 2018-07-15 MED ORDER — TETANUS-DIPHTH-ACELL PERTUSSIS 5-2.5-18.5 LF-MCG/0.5 IM SUSP
0.5000 mL | Freq: Once | INTRAMUSCULAR | Status: AC
  Administered 2018-07-17: 0.5 mL via INTRAMUSCULAR

## 2018-07-15 MED ORDER — WITCH HAZEL-GLYCERIN EX PADS: 1.0000 "application " | MEDICATED_PAD | CUTANEOUS | Status: DC | PRN

## 2018-07-15 MED ORDER — EPHEDRINE 5 MG/ML INJ
10.0000 mg | INTRAVENOUS | Status: DC | PRN
  Filled 2018-07-15: qty 2

## 2018-07-15 MED ORDER — ACETAMINOPHEN 325 MG PO TABS: 650.0000 mg | ORAL_TABLET | ORAL | Status: DC | PRN

## 2018-07-15 MED ORDER — SIMETHICONE 80 MG PO CHEW: 80.0000 mg | CHEWABLE_TABLET | ORAL | Status: DC | PRN

## 2018-07-15 MED ORDER — FENTANYL 2.5 MCG/ML BUPIVACAINE 1/10 % EPIDURAL INFUSION (WH - ANES)
14.0000 mL/h | INTRAMUSCULAR | Status: DC | PRN
  Administered 2018-07-15 (×2): 14 mL/h via EPIDURAL
  Filled 2018-07-15: qty 100

## 2018-07-15 MED ORDER — OXYTOCIN 40 UNITS IN LACTATED RINGERS INFUSION - SIMPLE MED
2.5000 [IU]/h | INTRAVENOUS | Status: DC
  Administered 2018-07-15: 2.5 [IU]/h via INTRAVENOUS
  Filled 2018-07-15: qty 1000

## 2018-07-15 MED ORDER — SENNOSIDES-DOCUSATE SODIUM 8.6-50 MG PO TABS
2.0000 | ORAL_TABLET | ORAL | Status: DC
  Administered 2018-07-15 – 2018-07-17 (×2): 2 via ORAL
  Filled 2018-07-15 (×2): qty 2

## 2018-07-15 MED ORDER — OXYCODONE-ACETAMINOPHEN 5-325 MG PO TABS: 1.0000 | ORAL_TABLET | ORAL | Status: DC | PRN

## 2018-07-15 MED ORDER — ONDANSETRON HCL 4 MG/2ML IJ SOLN: 4.0000 mg | INTRAMUSCULAR | Status: DC | PRN

## 2018-07-15 MED ORDER — OXYTOCIN BOLUS FROM INFUSION: 500.0000 mL | Freq: Once | INTRAVENOUS | Status: DC

## 2018-07-15 MED ORDER — COCONUT OIL OIL
1.0000 "application " | TOPICAL_OIL | Status: DC | PRN
  Administered 2018-07-16: 1 via TOPICAL
  Filled 2018-07-15: qty 120

## 2018-07-15 MED ORDER — DIPHENHYDRAMINE HCL 50 MG/ML IJ SOLN
12.5000 mg | INTRAMUSCULAR | Status: DC | PRN
  Administered 2018-07-15 (×2): 12.5 mg via INTRAVENOUS
  Filled 2018-07-15: qty 1

## 2018-07-15 MED ORDER — DIBUCAINE 1 % RE OINT: 1.0000 "application " | TOPICAL_OINTMENT | RECTAL | Status: DC | PRN

## 2018-07-15 MED ORDER — LIDOCAINE HCL (PF) 1 % IJ SOLN
INTRAMUSCULAR | Status: DC | PRN
  Administered 2018-07-15: 10 mL via EPIDURAL

## 2018-07-15 MED ORDER — LIDOCAINE HCL (PF) 1 % IJ SOLN
30.0000 mL | INTRAMUSCULAR | Status: DC | PRN
  Filled 2018-07-15: qty 30

## 2018-07-15 MED ORDER — TERBUTALINE SULFATE 1 MG/ML IJ SOLN
0.2500 mg | Freq: Once | INTRAMUSCULAR | Status: DC | PRN
  Filled 2018-07-15: qty 1

## 2018-07-15 MED ORDER — ONDANSETRON HCL 4 MG/2ML IJ SOLN: 4.0000 mg | Freq: Four times a day (QID) | INTRAMUSCULAR | Status: DC | PRN

## 2018-07-15 MED ORDER — LACTATED RINGERS IV SOLN
INTRAVENOUS | Status: DC
  Administered 2018-07-15 (×2): via INTRAVENOUS

## 2018-07-15 MED ORDER — PHENYLEPHRINE 40 MCG/ML (10ML) SYRINGE FOR IV PUSH (FOR BLOOD PRESSURE SUPPORT)
PREFILLED_SYRINGE | INTRAVENOUS | Status: AC
  Filled 2018-07-15: qty 10

## 2018-07-15 MED ORDER — PHENYLEPHRINE 40 MCG/ML (10ML) SYRINGE FOR IV PUSH (FOR BLOOD PRESSURE SUPPORT)
80.0000 ug | PREFILLED_SYRINGE | INTRAVENOUS | Status: DC | PRN
  Filled 2018-07-15: qty 5

## 2018-07-15 MED ORDER — FLEET ENEMA 7-19 GM/118ML RE ENEM: 1.0000 | ENEMA | RECTAL | Status: DC | PRN

## 2018-07-15 MED ORDER — SOD CITRATE-CITRIC ACID 500-334 MG/5ML PO SOLN: 30.0000 mL | ORAL | Status: DC | PRN

## 2018-07-15 MED ORDER — OXYCODONE-ACETAMINOPHEN 5-325 MG PO TABS: 2.0000 | ORAL_TABLET | ORAL | Status: DC | PRN

## 2018-07-15 MED ORDER — ACETAMINOPHEN 325 MG PO TABS
650.0000 mg | ORAL_TABLET | ORAL | Status: DC | PRN
  Administered 2018-07-16 – 2018-07-17 (×4): 650 mg via ORAL
  Filled 2018-07-15 (×4): qty 2

## 2018-07-15 MED ORDER — BENZOCAINE-MENTHOL 20-0.5 % EX AERO
1.0000 "application " | INHALATION_SPRAY | CUTANEOUS | Status: DC | PRN
  Administered 2018-07-15 – 2018-07-16 (×2): 1 via TOPICAL
  Filled 2018-07-15 (×2): qty 56

## 2018-07-15 MED ORDER — PENICILLIN G 3 MILLION UNITS IVPB - SIMPLE MED
3.0000 10*6.[IU] | INTRAVENOUS | Status: DC
  Administered 2018-07-15: 3 10*6.[IU] via INTRAVENOUS
  Filled 2018-07-15 (×2): qty 100

## 2018-07-15 MED ORDER — MISOPROSTOL 200 MCG PO TABS
ORAL_TABLET | ORAL | Status: AC
  Administered 2018-07-15: 800 ug
  Filled 2018-07-15: qty 4

## 2018-07-15 MED ORDER — OXYTOCIN 40 UNITS IN LACTATED RINGERS INFUSION - SIMPLE MED
1.0000 m[IU]/min | INTRAVENOUS | Status: DC
  Administered 2018-07-15: 2 m[IU]/min via INTRAVENOUS
  Administered 2018-07-15: 6 m[IU]/min via INTRAVENOUS

## 2018-07-15 MED ORDER — LACTATED RINGERS IV SOLN: 500.0000 mL | Freq: Once | INTRAVENOUS | Status: DC

## 2018-07-15 MED ORDER — PRENATAL MULTIVITAMIN CH
1.0000 | ORAL_TABLET | Freq: Every day | ORAL | Status: DC
  Administered 2018-07-16 – 2018-07-17 (×2): 1 via ORAL
  Filled 2018-07-15 (×2): qty 1

## 2018-07-15 MED ORDER — BETAMETHASONE SOD PHOS & ACET 6 (3-3) MG/ML IJ SUSP
12.0000 mg | INTRAMUSCULAR | Status: DC
  Administered 2018-07-15: 12 mg via INTRAMUSCULAR
  Filled 2018-07-15 (×2): qty 2

## 2018-07-15 MED ORDER — DIPHENHYDRAMINE HCL 25 MG PO CAPS: 25.0000 mg | ORAL_CAPSULE | Freq: Four times a day (QID) | ORAL | Status: DC | PRN

## 2018-07-15 MED ORDER — ZOLPIDEM TARTRATE 5 MG PO TABS: 5.0000 mg | ORAL_TABLET | Freq: Every evening | ORAL | Status: DC | PRN

## 2018-07-15 MED ORDER — SODIUM CHLORIDE 0.9 % IV SOLN
5.0000 10*6.[IU] | Freq: Once | INTRAVENOUS | Status: AC
  Administered 2018-07-15: 5 10*6.[IU] via INTRAVENOUS
  Filled 2018-07-15: qty 5

## 2018-07-15 MED ORDER — ONDANSETRON HCL 4 MG PO TABS: 4.0000 mg | ORAL_TABLET | ORAL | Status: DC | PRN

## 2018-07-15 NOTE — H&P (Signed)
Shelia Anderson is a 27 y.o. female presenting for PPROM. OB History    Gravida  1   Para      Term      Preterm      AB      Living  0     SAB      TAB      Ectopic      Multiple      Live Births             Past Medical History:  Diagnosis Date  . BV (bacterial vaginosis)   . Depression   . Hepatitis-C   . Substance abuse [facility])    Past Surgical History:  Procedure Laterality Date  . NO PAST SURGERIES     Family History: family history is not on file. Social History:  reports that she has been smoking cigarettes. She has been smoking about 0.30 packs per day. She has never used smokeless tobacco. She reports that she has current or past drug history. Drugs: Amphetamines, "Crack" cocaine, Cocaine, Marijuana, Methamphetamines, and Opium. She reports that she does not drink alcohol.  Nursing Staff Provider  Office Location [facility] Dating  1st trimester [place]  Language  English  Anatomy US  Needs f/u US to complete anatomy. Scheduled.   Flu Vaccine   Genetic Screen  First trimester: low risk    AFP: neg   TDaP vaccine    Hgb A1C or  GTT Early  Third trimester 115/140/84  Rhogam     LAB RESULTS   Feeding Plan Breast  Blood Type --/--/A NEG Performed at [facility] [address], [place], [place] [address] , A NEG (03/07 1546)   Contraception Nexplanon  Antibody POS (04/23 0930)  Circumcision Girl  Rubella  NOT immune  Pediatrician   RPR   NR  Support Person Maurice Driscoll(FOB) HBsAg Neg  Prenatal Classes  HIV  NR  BTL Consent n/a GBS  (For PCN allergy, check sensitivities)   VBAC Consent  Pap     Hgb Electro      CF     SMA     Waterbirth  [ ]  Class [ ]  Consent [ ]  CNM visit      Maternal Diabetes: Yes:  Diabetes Type:  Diet controlled Genetic Screening: Normal Maternal Ultrasounds/Referrals: Normal Fetal Ultrasounds or other Referrals:  None Maternal Substance Abuse:  Yes:  Type: Cocaine, Other: opiates Significant Maternal Medications:   None Significant Maternal Lab Results:  Lab values include: Other: Hep C+ Other Comments:  None  ROS Maternal Medical History:  Reason for admission: Rupture of membranes.   Contractions: Onset was 1-2 hours ago.   Frequency: irregular.   Perceived severity is mild.    Fetal activity: Perceived fetal activity is normal.   Last perceived fetal movement was within the past hour.    Prenatal complications: Infection (hep c), preterm labor and substance abuse.   Prenatal Complications - Diabetes: gestational. Diabetes is managed by diet.      Dilation: 4 Effacement (%): 100 Station: -2 Exam by:: m wilkins rnc Blood pressure (!) 143/99, pulse 80, temperature 98.8 F (37.1 C), temperature source Oral, resp. rate 16, height 5\' 3"  (1.6 m), weight 81.1 kg, last menstrual period 11/01/2017, unknown if currently breastfeeding. Maternal Exam:  Uterine Assessment: Contraction strength is mild.  Abdomen: Patient reports no abdominal tenderness. Introitus: Normal vulva. Normal vagina.  Ferning test: positive.   Pelvis: adequate for delivery.   Cervix: Cervix evaluated  by digital exam.     Fetal Exam Fetal Monitor Review: Mode: ultrasound.   Baseline rate: 135.  Variability: moderate (6-25 bpm).   Pattern: no accelerations and no decelerations.    Fetal State Assessment: Category I - tracings are normal.     Physical Exam  Nursing note and vitals reviewed. Constitutional: She is oriented to person, place, and time. She appears well-developed and well-nourished. No distress.  HENT:  Head: Normocephalic.  Cardiovascular: Normal rate.  Respiratory: Effort normal.  GI: Soft.  Neurological: She is alert and oriented to person, place, and time.  Skin: Skin is warm and dry.  Psychiatric: She has a normal mood and affect.    Prenatal labs: ABO, Rh: --/--/A NEG (09/28 0806) Antibody: POS (09/28 0806) Rubella:   RPR: Non Reactive (09/03 0849)  HBsAg: Negative (09/10 1135)  HIV:  Non Reactive (09/03 0849)  GBS:   NEGATIVE   Assessment/Plan: 27 y.o. G1P0 at [redacted]w[redacted]d with PPROM Admit to labor and delivery  SW consult-> homelessness issues, drug use Rapid drug screen pending Hypertensive on admission, pre-eclampsia labs pending Induction of labor with pitocin Anticipate NSVD Pain control with epidural PRN   Betamethasone for less than 37 weeks  Thressa Sheller 07/15/2018, 10:51 AM

## 2018-07-15 NOTE — Anesthesia Preprocedure Evaluation (Signed)
Anesthesia Evaluation  Patient identified by MRN, date of birth, ID band Patient awake    Reviewed: Allergy & Precautions, H&P , NPO status , Patient's Chart, lab work & pertinent test results  History of Anesthesia Complications Negative for: history of anesthetic complications  Airway Mallampati: II  TM Distance: >3 FB Neck ROM: full    Dental no notable dental hx.    Pulmonary neg pulmonary ROS, Current Smoker,    Pulmonary exam normal        Cardiovascular hypertension, Normal cardiovascular exam Rhythm:regular Rate:Normal     Neuro/Psych negative neurological ROS  negative psych ROS   GI/Hepatic negative GI ROS, (+)     substance abuse  , Hepatitis -, C  Endo/Other  diabetes, Gestational  Renal/GU negative Renal ROS  negative genitourinary   Musculoskeletal   Abdominal   Peds  Hematology negative hematology ROS (+)   Anesthesia Other Findings   Reproductive/Obstetrics (+) Pregnancy                             Anesthesia Physical Anesthesia Plan  ASA: II  Anesthesia Plan: Epidural   Post-op Pain Management:    Induction:   PONV Risk Score and Plan:   Airway Management Planned:   Additional Equipment:   Intra-op Plan:   Post-operative Plan:   Informed Consent: I have reviewed the patients History and Physical, chart, labs and discussed the procedure including the risks, benefits and alternatives for the proposed anesthesia with the patient or authorized representative who has indicated his/her understanding and acceptance.     Plan Discussed with:   Anesthesia Plan Comments:         Anesthesia Quick Evaluation

## 2018-07-15 NOTE — MAU Note (Signed)
Pt presents to MAU  With complaints of leakage of fluid since 0600. Denies any VB.Marland Kitchen +FM

## 2018-07-15 NOTE — Anesthesia Procedure Notes (Signed)
Epidural Patient location during procedure: OB Start time: 07/15/2018 11:48 AM End time: 07/15/2018 12:04 PM  Staffing Anesthesiologist: Lucretia Kern, MD Performed: anesthesiologist   Preanesthetic Checklist Completed: patient identified, pre-op evaluation, timeout performed, IV checked, risks and benefits discussed and monitors and equipment checked  Epidural Patient position: sitting Prep: DuraPrep Patient monitoring: heart rate, continuous pulse ox and blood pressure Approach: midline Location: L3-L4 Injection technique: LOR saline  Needle:  Needle type: Tuohy  Needle gauge: 17 G Needle length: 9 cm Needle insertion depth: 7 cm Catheter type: closed end flexible Catheter size: 19 Gauge Catheter at skin depth: 12 cm  Assessment Events: blood not aspirated, injection not painful, no injection resistance, negative IV test and no paresthesia  Additional Notes Reason for block:procedure for pain

## 2018-07-15 NOTE — MAU Note (Signed)
PT SAYS SROM AT 0600- CLEAR  FLUID.   VE IN OFFICE - YESTERDAY - 2 CM.   DENIES HSV AND MRSA.  GBS- COLLECTED  YESTERDAY.     NO  UC THIS AM - FEELS PRESSURE.

## 2018-07-16 LAB — RPR: RPR: NONREACTIVE

## 2018-07-16 MED ORDER — RHO D IMMUNE GLOBULIN 1500 UNIT/2ML IJ SOSY
300.0000 ug | PREFILLED_SYRINGE | Freq: Once | INTRAMUSCULAR | Status: AC
  Administered 2018-07-16: 300 ug via INTRAMUSCULAR
  Filled 2018-07-16: qty 2

## 2018-07-16 MED ORDER — PNEUMOCOCCAL VAC POLYVALENT 25 MCG/0.5ML IJ INJ
0.5000 mL | INJECTION | INTRAMUSCULAR | Status: AC
  Administered 2018-07-17: 0.5 mL via INTRAMUSCULAR
  Filled 2018-07-16 (×2): qty 0.5

## 2018-07-16 MED ORDER — INFLUENZA VAC SPLIT QUAD 0.5 ML IM SUSY
0.5000 mL | PREFILLED_SYRINGE | INTRAMUSCULAR | Status: AC
  Administered 2018-07-17: 0.5 mL via INTRAMUSCULAR

## 2018-07-16 MED ORDER — FAMOTIDINE 20 MG PO TABS
20.0000 mg | ORAL_TABLET | Freq: Every day | ORAL | Status: DC
  Administered 2018-07-16 – 2018-07-17 (×3): 20 mg via ORAL
  Filled 2018-07-16 (×5): qty 1

## 2018-07-16 MED ORDER — SODIUM CHLORIDE 0.9 % IV SOLN
3.0000 g | Freq: Four times a day (QID) | INTRAVENOUS | Status: DC
  Administered 2018-07-16 (×2): 3 g via INTRAVENOUS
  Filled 2018-07-16 (×3): qty 3

## 2018-07-16 NOTE — Progress Notes (Addendum)
POSTPARTUM PROGRESS NOTE  Post Partum Day 1 Subjective:  Shelia Anderson is a 27 y.o. G1P0101 [redacted]w[redacted]d s/p SVD.  No acute events overnight.  Pt denies problems with ambulating, voiding or po intake.  She denies nausea or vomiting.  Pain is moderately controlled.  She has had flatus. She has not had bowel movement.  Lochia Large.   Objective: Blood pressure 137/82, pulse 76, temperature 98.3 F (36.8 C), temperature source Oral, resp. rate 18, height 5\' 3"  (1.6 m), weight 81.1 kg, last menstrual period 11/01/2017, SpO2 98 %, unknown if currently breastfeeding.  Physical Exam:  General: alert, cooperative and no distress Lochia:normal flow Chest: CTAB Heart: RRR no m/r/g Abdomen: +BS, soft, nontender,  Uterine Fundus: firm DVT Evaluation: No calf swelling or tenderness Extremities: no edema  Recent Labs    07/15/18 0806  HGB 11.0*  HCT 33.0*    Assessment/Plan:  ASSESSMENT: Shelia Anderson is a 27 y.o. G1P0101 [redacted]w[redacted]d s/p SVD.  #MOF: breast #MOC: nexplanon #Fever: shortly after receiving cytotec, has received cefotan x1 and 2 doses of unasyn.  Will discontinue Unasyn and observe. #DC: possible DC 9/30   LOS: 1 day   Mirian Mo, MD 07/16/2018, 8:42 AM   OB FELLOW POSTPARTUM PROGRESS NOTE ATTESTATION  I have seen and examined this patient and agree with above documentation in the resident's note.   Marcy Siren, D.O. OB Fellow  07/17/2018, 4:49 PM

## 2018-07-16 NOTE — Lactation Note (Signed)
This note was copied from a baby's chart. Lactation Consultation Note  Patient Name: Girl [place] Today's Date: 07/16/2018 Reason for consult: Initial assessment;Late-preterm 34-36.6wks P1, 10 hour, LPTI Per mom BF in labor and delivery  and one time in room but decided not to BF infant. Per mom, infant latched well but she doesn't want to BF prefers to give infant formula. LC discussed with mom if she changes her mind about BF  we are here to help support and assist her with BF.   Maternal Data    Feeding Feeding Type: Formula Nipple Type: Slow - flow  LATCH Score                   Interventions    Lactation Tools Discussed/Used     Consult Status      Danelle Earthly 07/16/2018, 5:17 AM

## 2018-07-16 NOTE — Clinical Social Work Maternal (Signed)
CLINICAL SOCIAL WORK MATERNAL/CHILD NOTE  Patient Details  Name: Shelia Anderson MRN: [id number] Date of Birth: 01/17/1991  Date:  11-Apr-2018  Clinical Social Worker Initiating Note:  Madilyn Fireman, MSW, LCSW-A Date/Time: Initiated:  07/16/18/1138     Child's Name:  Leonette Nutting   Biological Parents:  Mother, Father   Need for Interpreter:  None   Reason for Referral:  Current Substance Use/Substance Use During Pregnancy    Address:  [address] [place] [address]    Phone number:  [phone] (home)     Additional phone number:   Household Members/Support Persons (HM/SP):   Household Member/Support Person 1   HM/SP Name Relationship DOB or Age  HM/SP -[place], FOB    HM/SP -2        HM/SP -3        HM/SP -4        HM/SP -5        HM/SP -6        HM/SP -7        HM/SP -8          Natural Supports (not living in the home):  Extended Family, Friends, Immediate Family   Professional Supports: None   Employment: Unemployed   Type of Work:     Education:  Programmer, systems   Homebound arranged:    Museum/gallery curator Resources:  [employer]   Other Resources:  [employer], Physicist, medical    Cultural/Religious Considerations Which May Impact Care:  Christian  Strengths:  Ability to meet basic needs , Psychotropic Medications, Home prepared for child    Psychotropic Medications:  Trazodone, Celexa(MOB did not take medications during pregnancy, will resume PP)      Pediatrician:     Not yet chosen  Pediatrician List:   [place]      Pediatrician Fax Number:    Risk Factors/Current Problems:      Cognitive State:  Alert , Able to Concentrate    Mood/Affect:  Calm , Comfortable , Happy , Interested    CSW Assessment: CSW received consult for MOB due to history of polysubstance use and mental health concerns. CSW met with MOB, FOB  Maurice, and baby Jacquetta at bedside to complete assessment. CSW obtained permission from MOB to discuss with FOB present. CSW inquired with MOB regarding her mental health history, MOB states she has diagnoses of bipolar 1, anxiety, and depression. MOB reports being on Celexa and Trazadone prior to pregnancy and intends to resume those medications postpartum. MOB was advised to not take those medications due to them not being pregnancy safe. MOB sees a Social worker at [facility] in [place]. MOB denies mental health concerns at this time, including SI or HI. CSW and MOB discussed her history of polysubstance use, MOB reports she has used heroin, Xanax, Gabapentin, marijuana, and cocaine in the past. MOB reports being on probation until October 2019 for a felony conviction of breaking and entering that happened in 2016. MOB reports being drug tested weekly through her probation office. MOB denies any substance use during pregnancy. MOB was educated on hospital drug screening policies and did not have questions or concerns. MOB reports that she began experimenting with heavy drugs around age 66. MOB was seen in the ED in June of 2019 due to an overdose. CSW inquired about  event, MOB states she was hanging out with a girl she had just met and the two of them picked up heroin and did some. MOB reports also taking two Xanax bars that day that impacted her ability to remember anything. MOB states this event occurred because FOB was drunk and passed out. MOB is unemployed, receives [employer], [employer], and Physicist, medical. FOB works full time at [employer]. This is MOB's first child. This is FOB's second child, his oldest being 7 years.  MOB reports having a used car seat for infant with knowledge of installation and use. MOB reports that Rylee has her own room and will sleep in a bassinet. SIDS precautions thoroughly reviewed. MOB reports not choosing a pediatrician yet. MOB reports a good support system from her friends and FOB's family.  MOB was encouraged to reach out for assistance if needs or questions arise, she was agreeable.  Infant's UDS was negative. CSW will continue to monitor chart for cord results and will make report if warranted.   CSW Plan/Description:  CSW Will Continue to Monitor Umbilical Cord Tissue Drug Screen Results and Make Report if Warranted, Psychosocial Support and Ongoing Assessment of Needs    Archie Endo, LCSWA 07/16/2018, 11:40 AM

## 2018-07-16 NOTE — Anesthesia Postprocedure Evaluation (Signed)
Anesthesia Post Note  Patient: Shelia Anderson  Procedure(s) Performed: AN AD HOC LABOR EPIDURAL     Patient location during evaluation: Mother Baby Anesthesia Type: Epidural Level of consciousness: awake and alert and oriented Pain management: satisfactory to patient Vital Signs Assessment: post-procedure vital signs reviewed and stable Respiratory status: respiratory function stable Cardiovascular status: stable Postop Assessment: no headache, no backache, epidural receding, patient able to bend at knees, no signs of nausea or vomiting and adequate PO intake Anesthetic complications: no Comments: The patient was concerned and annoyed because her right leg was still numb 12 hours after the delivery of her infant. She stated that it had been much more numb than her left leg since the insertion of her epidural.    Last Vitals:  Vitals:   07/16/18 0508 07/16/18 0900  BP: 137/82 110/86  Pulse: 76 74  Resp: 18 18  Temp: 36.8 C (!) 35.8 C  SpO2: 98% 100%    Last Pain:  Vitals:   07/16/18 0900  TempSrc: Axillary  PainSc: 2    Pain Goal:                 [name]

## 2018-07-17 LAB — TYPE AND SCREEN
ABO/RH(D): A NEG
ANTIBODY SCREEN: POSITIVE
Unit division: 0
Unit division: 0

## 2018-07-17 LAB — BPAM RBC
BLOOD PRODUCT EXPIRATION DATE: 201910282359
Blood Product Expiration Date: 201910292359
UNIT TYPE AND RH: 600
Unit Type and Rh: 600

## 2018-07-17 LAB — RH IG WORKUP (INCLUDES ABO/RH)
ABO/RH(D): A NEG
FETAL SCREEN: NEGATIVE
GESTATIONAL AGE(WKS): 36.4
UNIT DIVISION: 0

## 2018-07-17 MED ORDER — IBUPROFEN 600 MG PO TABS: 600.0000 mg | ORAL_TABLET | Freq: Four times a day (QID) | ORAL | 0 refills | Status: DC

## 2018-07-17 MED ORDER — ACETAMINOPHEN 325 MG PO TABS: 650.0000 mg | ORAL_TABLET | ORAL | 0 refills | Status: DC | PRN

## 2018-07-17 NOTE — Progress Notes (Addendum)
CSW acknowledges consult and consult is being screen out due to PMAD education was provided by weekend CSW.   Please contact CSW if other needs arise or at patient's request.  Blaine Hamper, MSW, LCSW Clinical Social Work [phone]

## 2018-07-17 NOTE — Discharge Summary (Addendum)
OB Discharge Summary     Patient Name: Shelia Anderson DOB: 06-13-1991 MRN: [id number]  Date of admission: 07/15/2018 Delivering MD: Thressa Sheller D   Date of discharge: 07/17/2018  Admitting diagnosis: 37 wks ctx Intrauterine pregnancy: [redacted]w[redacted]d     Secondary diagnosis:  Active Problems:   PROM (premature rupture of membranes)  Additional problems: PPROM, preterm delivery     Discharge diagnosis: Preterm Pregnancy Delivered                                                                                                Post partum procedures:rhogam  Augmentation: none  Complications: None  Hospital course:  Onset of Labor With Vaginal Delivery     27 y.o. yo G1P0101 at [redacted]w[redacted]d was admitted in Latent Labor on 07/15/2018. Patient had an uncomplicated labor course as follows:  Membrane Rupture Time/Date: 6:00 AM ,07/15/2018   Intrapartum Procedures: Episiotomy: None [1]                                         Lacerations:  1st degree [2]  Patient had a delivery of a Viable infant. 07/15/2018  Information for the patient's newborn:  Rakeb, [place] Girl [place] [[id number]]  Delivery Method: Vaginal, Spontaneous(Filed from Delivery Summary)    Pateint had an uncomplicated postpartum course.  She is ambulating, tolerating a regular diet, passing flatus, and urinating well. Patient is discharged home in stable condition on 07/17/18.   Physical exam  Vitals:   07/16/18 0508 07/16/18 0900 07/16/18 1439 07/17/18 0505  BP: 137/82 110/86 127/89 119/80  Pulse: 76 74 69 (!) 58  Resp: 18 18  18   Temp: 98.3 F (36.8 C) (!) 96.5 F (35.8 C) 98.3 F (36.8 C) 97.8 F (36.6 C)  TempSrc: Oral Axillary Oral Oral  SpO2: 98% 100%  99%  Weight:      Height:       General: alert Lochia: appropriate Uterine Fundus: firm Incision: N/A DVT Evaluation: No evidence of DVT seen on physical exam. Labs: Lab Results  Component Value Date   WBC 9.7 07/15/2018   HGB 11.0 (L) 07/15/2018   HCT 33.0  (L) 07/15/2018   MCV 90.7 07/15/2018   PLT 400 07/15/2018   CMP Latest Ref Rng & Units 07/15/2018  Glucose 70 - 99 mg/dL 78  BUN 6 - 20 mg/dL 9  Creatinine 7.82 - 9.56 mg/dL 2.13  Sodium 086 - 578 mmol/L 134(L)  Potassium 3.5 - 5.1 mmol/L 4.1  Chloride 98 - 111 mmol/L 106  CO2 22 - 32 mmol/L 17(L)  Calcium 8.9 - 10.3 mg/dL 4.6(N)  Total Protein 6.5 - 8.1 g/dL 6.3(L)  Total Bilirubin 0.3 - 1.2 mg/dL 0.7  Alkaline Phos 38 - 126 U/L 239(H)  AST 15 - 41 U/L 33  ALT 0 - 44 U/L 42    Discharge instruction: per After Visit Summary and "Baby and Me Booklet".  After visit meds:  Allergies as of 07/17/2018      Reactions  Latex Itching, Hives      Medication List    STOP taking these medications   ACCU-CHEK FASTCLIX LANCETS Misc   glucose blood test strip   IRON PO     TAKE these medications   acetaminophen 325 MG tablet Commonly known as:  TYLENOL Take 2 tablets (650 mg total) by mouth every 4 (four) hours as needed (for pain scale < 4).   ibuprofen 600 MG tablet Commonly known as:  ADVIL,MOTRIN Take 1 tablet (600 mg total) by mouth every 6 (six) hours.   prenatal multivitamin Tabs tablet Take 1 tablet by mouth daily at 12 noon.       Diet: routine diet  Activity: Advance as tolerated. Pelvic rest for 6 weeks.   Outpatient follow up:4 weeks Follow up Appt: Future Appointments  Date Time Provider Department Center  08/17/2018  2:00 PM Levie Heritage, DO [facility] None   Follow up Visit:No follow-ups on file.  Postpartum contraception: Nexplanon  Newborn Data: Live born female  Birth Weight: 7 lb 15.3 oz (3610 g) APGAR: 8, 9  Newborn Delivery   Birth date/time:  07/15/2018 18:40:00 Delivery type:  Vaginal, Spontaneous     Baby Feeding: Breast Disposition:rooming in   07/17/2018 Mirian Mo, MD  Attestation: I have seen this patient and agree with the resident's documentation. I have examined them separately, and we have discussed the plan of  care.  Cristal Deer. Earlene Plater, DO OB/GYN Fellow

## 2018-07-17 NOTE — Discharge Instructions (Signed)
Vaginal Delivery, Care After °Refer to this sheet in the next few weeks. These instructions provide you with information about caring for yourself after vaginal delivery. Your health care provider may also give you more specific instructions. Your treatment has been planned according to current medical practices, but problems sometimes occur. Call your health care provider if you have any problems or questions. °What can I expect after the procedure? °After vaginal delivery, it is common to have: °· Some bleeding from your vagina. °· Soreness in your abdomen, your vagina, and the area of skin between your vaginal opening and your anus (perineum). °· Pelvic cramps. °· Fatigue. ° °Follow these instructions at home: °Medicines °· Take over-the-counter and prescription medicines only as told by your health care provider. °· If you were prescribed an antibiotic medicine, take it as told by your health care provider. Do not stop taking the antibiotic until it is finished. °Driving ° °· Do not drive or operate heavy machinery while taking prescription pain medicine. °· Do not drive for 24 hours if you received a sedative. °Lifestyle °· Do not drink alcohol. This is especially important if you are breastfeeding or taking medicine to relieve pain. °· Do not use tobacco products, including cigarettes, chewing tobacco, or e-cigarettes. If you need help quitting, ask your health care provider. °Eating and drinking °· Drink at least 8 eight-ounce glasses of water every day unless you are told not to by your health care provider. If you choose to breastfeed your baby, you may need to drink more water than this. °· Eat high-fiber foods every day. These foods may help prevent or relieve constipation. High-fiber foods include: °? Whole grain cereals and breads. °? Brown rice. °? Beans. °? Fresh fruits and vegetables. °Activity °· Return to your normal activities as told by your health care provider. Ask your health care provider  what activities are safe for you. °· Rest as much as possible. Try to rest or take a nap when your baby is sleeping. °· Do not lift anything that is heavier than your baby or 10 lb (4.5 kg) until your health care provider says that it is safe. °· Talk with your health care provider about when you can engage in sexual activity. This may depend on your: °? Risk of infection. °? Rate of healing. °? Comfort and desire to engage in sexual activity. °Vaginal Care °· If you have an episiotomy or a vaginal tear, check the area every day for signs of infection. Check for: °? More redness, swelling, or pain. °? More fluid or blood. °? Warmth. °? Pus or a bad smell. °· Do not use tampons or douches until your health care provider says this is safe. °· Watch for any blood clots that may pass from your vagina. These may look like clumps of dark red, brown, or black discharge. °General instructions °· Keep your perineum clean and dry as told by your health care provider. °· Wear loose, comfortable clothing. °· Wipe from front to back when you use the toilet. °· Ask your health care provider if you can shower or take a bath. If you had an episiotomy or a perineal tear during labor and delivery, your health care provider may tell you not to take baths for a certain length of time. °· Wear a bra that supports your breasts and fits you well. °· If possible, have someone help you with household activities and help care for your baby for at least a few days after   you leave the hospital. °· Keep all follow-up visits for you and your baby as told by your health care provider. This is important. °Contact a health care provider if: °· You have: °? Vaginal discharge that has a bad smell. °? Difficulty urinating. °? Pain when urinating. °? A sudden increase or decrease in the frequency of your bowel movements. °? More redness, swelling, or pain around your episiotomy or vaginal tear. °? More fluid or blood coming from your episiotomy or  vaginal tear. °? Pus or a bad smell coming from your episiotomy or vaginal tear. °? A fever. °? A rash. °? Little or no interest in activities you used to enjoy. °? Questions about caring for yourself or your baby. °· Your episiotomy or vaginal tear feels warm to the touch. °· Your episiotomy or vaginal tear is separating or does not appear to be healing. °· Your breasts are painful, hard, or turn red. °· You feel unusually sad or worried. °· You feel nauseous or you vomit. °· You pass large blood clots from your vagina. If you pass a blood clot from your vagina, save it to show to your health care provider. Do not flush blood clots down the toilet without having your health care provider look at them. °· You urinate more than usual. °· You are dizzy or light-headed. °· You have not breastfed at all and you have not had a menstrual period for 12 weeks after delivery. °· You have stopped breastfeeding and you have not had a menstrual period for 12 weeks after you stopped breastfeeding. °Get help right away if: °· You have: °? Pain that does not go away or does not get better with medicine. °? Chest pain. °? Difficulty breathing. °? Blurred vision or spots in your vision. °? Thoughts about hurting yourself or your baby. °· You develop pain in your abdomen or in one of your legs. °· You develop a severe headache. °· You faint. °· You bleed from your vagina so much that you fill two sanitary pads in one hour. °This information is not intended to replace advice given to you by your health care provider. Make sure you discuss any questions you have with your health care provider. °Document Released: 10/01/2000 Document Revised: 03/17/2016 Document Reviewed: 10/19/2015 °Elsevier Interactive Patient Education © 2018 [employer]vier Inc. ° °

## 2018-07-18 ENCOUNTER — Ambulatory Visit: Payer: Self-pay

## 2018-07-18 LAB — GC/CHLAMYDIA PROBE AMP (~~LOC~~) NOT AT ARMC
CHLAMYDIA, DNA PROBE: NEGATIVE
NEISSERIA GONORRHEA: NEGATIVE

## 2018-07-18 NOTE — Lactation Note (Signed)
This note was copied from a baby's chart. Lactation Consultation Note  Patient Name: Girl [place] Lyssy Today's Date: 07/18/2018 Reason for consult: Follow-up assessment;Late-preterm 34-36.6wks;Hyperbilirubinemia Mom reports that baby is latching well to breast.  Baby is also receiving formula supplementation.  Mom is using DEBP and obtaining small amount of colostrum.  Phototherapy was discontinued and bili level will be checked tonight and in the morning.  Mom denies questions or concerns.  Encouraged to call for assist/concerns prn.  Maternal Data    Feeding    LATCH Score                   Interventions    Lactation Tools Discussed/Used     Consult Status Consult Status: Follow-up Date: 07/19/18 Follow-up type: In-patient    Huston Foley 07/18/2018, 10:57 AM

## 2018-07-19 LAB — CULTURE, BETA STREP (GROUP B ONLY): STREP GP B CULTURE: NEGATIVE

## 2018-07-21 ENCOUNTER — Encounter: Payer: Self-pay | Admitting: Family Medicine

## 2018-07-21 ENCOUNTER — Ambulatory Visit (HOSPITAL_COMMUNITY): Payer: Medicaid Other

## 2018-07-28 ENCOUNTER — Ambulatory Visit (HOSPITAL_COMMUNITY): Payer: Medicaid Other

## 2018-07-28 ENCOUNTER — Encounter: Payer: Self-pay | Admitting: Obstetrics & Gynecology

## 2018-07-28 ENCOUNTER — Ambulatory Visit (INDEPENDENT_AMBULATORY_CARE_PROVIDER_SITE_OTHER): Payer: Medicaid Other | Admitting: Obstetrics & Gynecology

## 2018-07-28 VITALS — BP 139/88 | HR 72 | Wt 153.1 lb

## 2018-07-28 DIAGNOSIS — R030 Elevated blood-pressure reading, without diagnosis of hypertension: Secondary | ICD-10-CM

## 2018-07-28 DIAGNOSIS — O9089 Other complications of the puerperium, not elsewhere classified: Secondary | ICD-10-CM

## 2018-07-28 DIAGNOSIS — R102 Pelvic and perineal pain: Principal | ICD-10-CM

## 2018-07-28 NOTE — Progress Notes (Signed)
History:  27 y.o. G1P0101 here today for perineal pain. Pt is s/p SVD with repair of peri-clitoral laceration on 07/15/2018. She reports pain today and was worried about her incision. She denies f/c. She is breast feeding.  Pt reports a mild HA with no visual changes.     The following portions of the patient's history were reviewed and updated as appropriate: allergies, current medications, past family history, past medical history, past social history, past surgical history and problem list.  Review of Systems:  Pertinent items are noted in HPI.    Objective:  Physical Exam Blood pressure 139/88, pulse 72, weight 153 lb 1.3 oz (69.4 kg), unknown if currently breastfeeding.  CONSTITUTIONAL: Well-developed, well-nourished female in no acute distress.  HENT:  Normocephalic, atraumatic EYES: Conjunctivae and EOM are normal. No scleral icterus.  NECK: Normal range of motion SKIN: Skin is warm and dry. No rash noted. Not diaphoretic.No pallor. NEUROLGIC: Alert and oriented to person, place, and time. Normal coordination.  Pelvic: Normal appearing external genitalia; there is suture noted periurethrally on the right. There  is a small amount of lochia.    Assessment & Plan:  Post partum state-  Perineum healing well  Elevated BP  Repeat and WNL  F/u in 1 week to have BP repeated.   Total face-to-face time with patient was 15 min.  Greater than 50% was spent in counseling and coordination of care with the patient.   Shahzaib Azevedo L. Harraway-Smith, M.D., [facility]

## 2018-08-04 ENCOUNTER — Ambulatory Visit (HOSPITAL_COMMUNITY): Payer: Medicaid Other

## 2018-08-05 ENCOUNTER — Other Ambulatory Visit: Payer: Self-pay | Admitting: Family Medicine

## 2018-08-17 ENCOUNTER — Ambulatory Visit (INDEPENDENT_AMBULATORY_CARE_PROVIDER_SITE_OTHER): Payer: Medicaid Other | Admitting: Family Medicine

## 2018-08-17 ENCOUNTER — Encounter: Payer: Self-pay | Admitting: Family Medicine

## 2018-08-17 DIAGNOSIS — Z3202 Encounter for pregnancy test, result negative: Secondary | ICD-10-CM

## 2018-08-17 DIAGNOSIS — Z30017 Encounter for initial prescription of implantable subdermal contraceptive: Secondary | ICD-10-CM

## 2018-08-17 LAB — POCT URINE PREGNANCY: PREG TEST UR: NEGATIVE

## 2018-08-17 MED ORDER — ETONOGESTREL 68 MG ~~LOC~~ IMPL
68.0000 mg | DRUG_IMPLANT | Freq: Once | SUBCUTANEOUS | Status: AC
  Administered 2018-08-17: 68 mg via SUBCUTANEOUS

## 2018-08-17 NOTE — Progress Notes (Signed)
Post Partum Exam  Shelia Anderson is a 27 y.o. G1P0101 female who presents for a postpartum visit. She is 4 weeks postpartum following a spontaneous vaginal delivery. I have fully reviewed the prenatal and intrapartum course. The delivery was at 36.4 gestational weeks.  Anesthesia: epidural. Postpartum course has been uneventful. Baby's course has been uneventful. Baby is feeding by bottle- Gerber formula. Bleeding no bleeding. Bowel function is normal. Bladder function is normal. Patient is not sexually active. Contraception method is Nexplanon. Postpartum depression screening:neg (score10)  The following portions of the patient's history were reviewed and updated as appropriate: allergies, current medications, past family history, past medical history, past social history, past surgical history and problem list. Last pap smear done   Review of Systems Pertinent items are noted in HPI.    Objective:  unknown if currently breastfeeding.  General:  alert, cooperative and no distress  Lungs: clear to auscultation bilaterally  Heart:  regular rate and rhythm, S1, S2 normal, no murmur, click, rub or gallop  Abdomen: soft, non-tender; bowel sounds normal; no masses,  no organomegaly         Nexplanon Insertion:  Patient given informed consent, signed copy in the chart, time out was performed. Pregnancy test was negative. Appropriate time out taken.  Patient's legt arm was prepped and draped in the usual sterile fashion.. The ruler used to measure and mark insertion area.  Pt was prepped with alcohol swab and then injected with 5 cc of 1% lidocaine with epinephrine.  Pt was prepped with betadine, Implanon removed form packaging,  Device confirmed in needle, then inserted full length of needle and withdrawn per handbook instructions.  Device palpated by physician and patient.  Pt insertion site covered with pressure dressing.   Minimal blood loss.  Pt tolerated the procedure well.    Assessment:     Normal postpartum exam. Pap smear not done at today's visit.   Plan:   1. Contraception: Nexplanon 2. Follow up in: 3 months or as needed.

## 2018-08-17 NOTE — Patient Instructions (Signed)
Nexplanon Instructions After Insertion  Keep bandage clean and dry for 24 hours  May use ice/Tylenol/Ibuprofen for soreness or pain  If you develop fever, drainage or increased warmth from incision site-contact office immediately   

## 2018-10-18 IMAGING — US US MFM FETAL BPP W/O NON-STRESS
1 series · 15 of 28 positions shown · non-contrast
Comparison: none

[Series 1: us mfm fetal bpp w/o non-stress · 28 acquisitions, 15 frames shown]
[im 1/28]
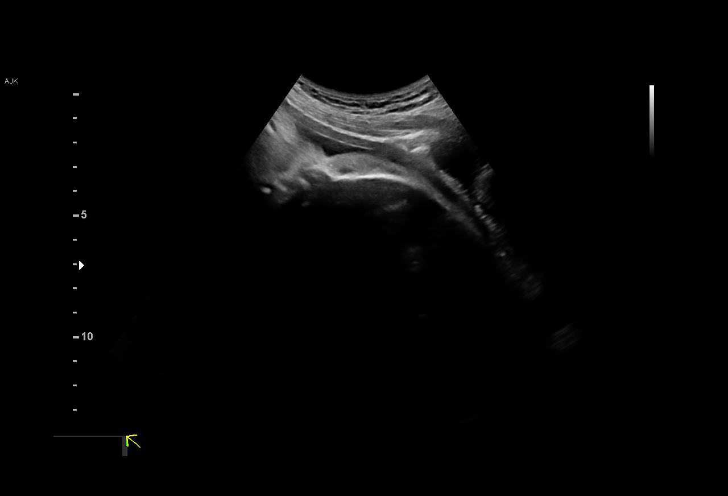
[im 3/28]
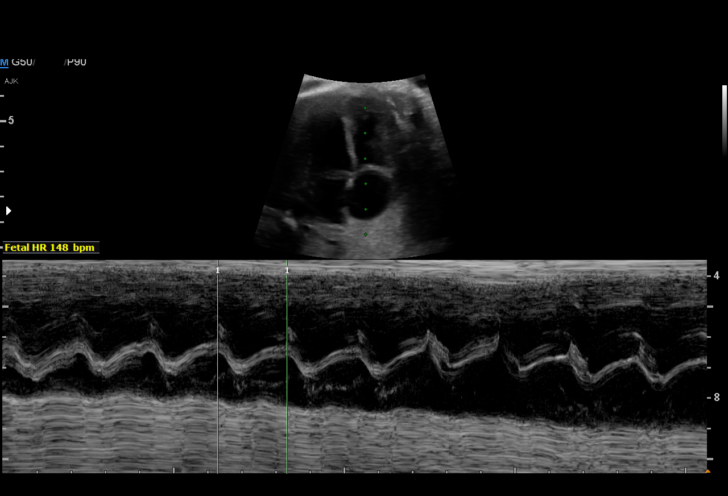
[im 5/28]
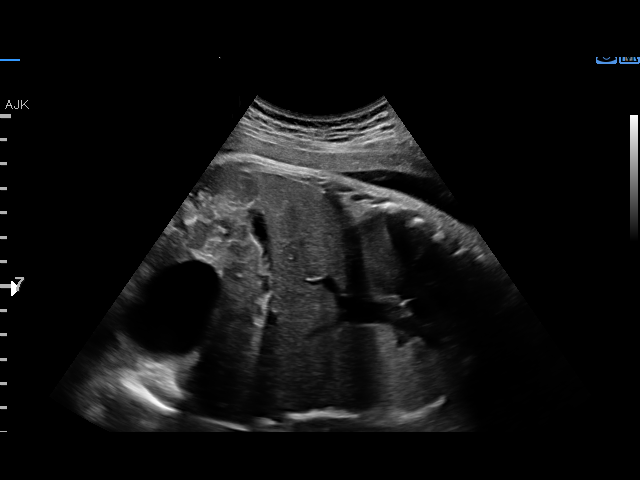
[im 7/28]
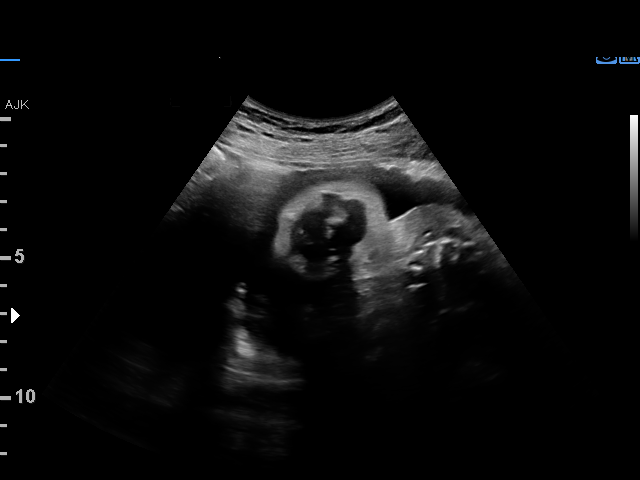
[im 9/28]
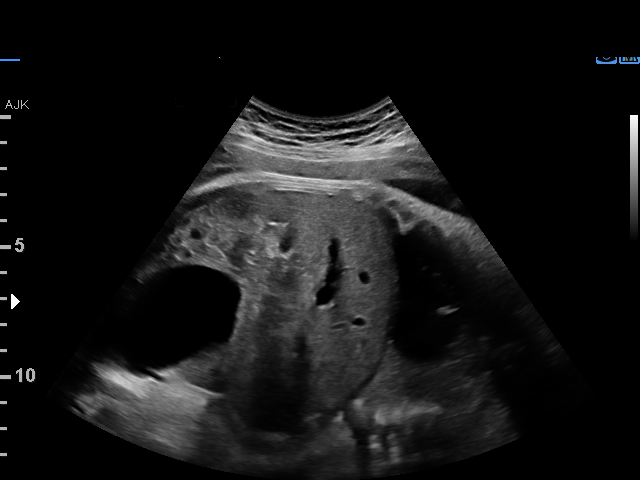
[im 11/28]
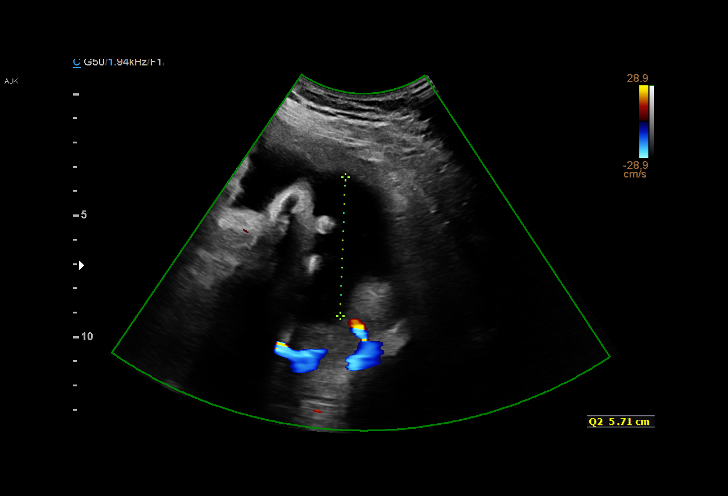
[im 13/28]
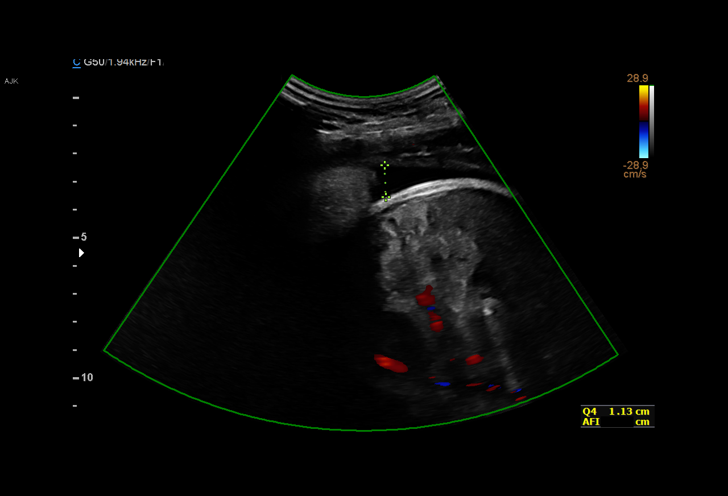
[im 15/28]
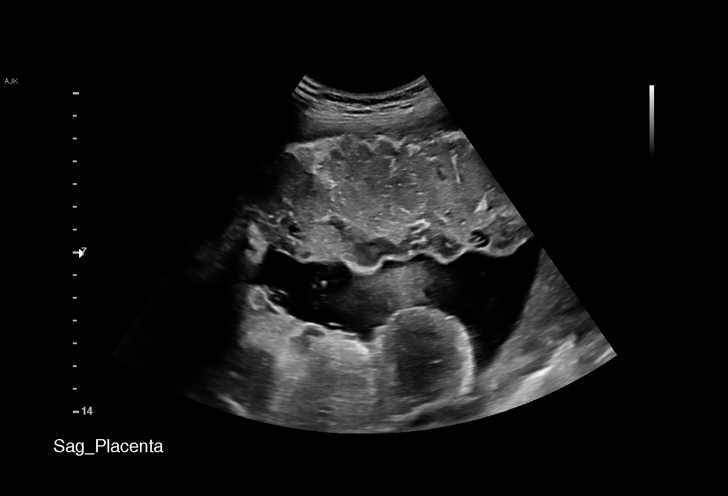
[im 16/28]
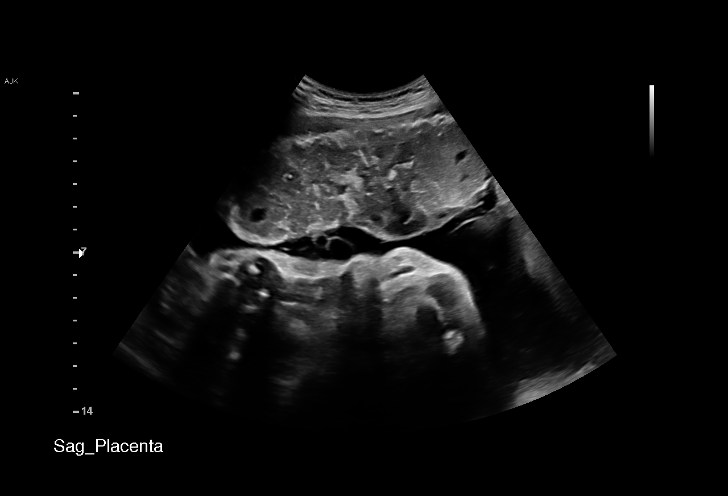
[im 18/28]
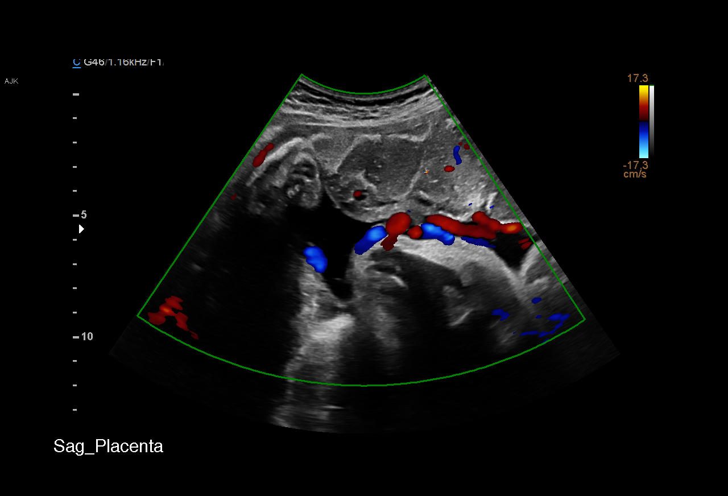
[im 20/28]
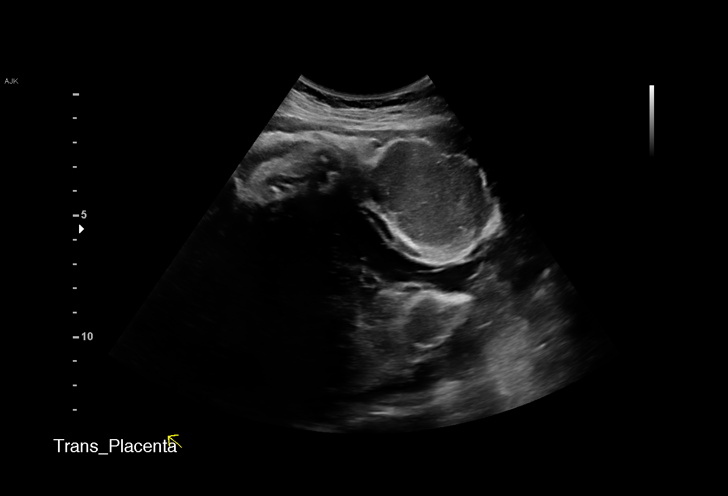
[im 22/28]
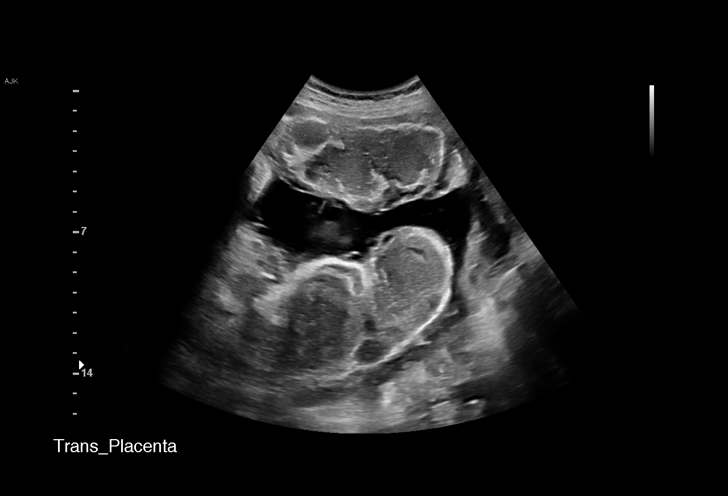
[im 24/28]
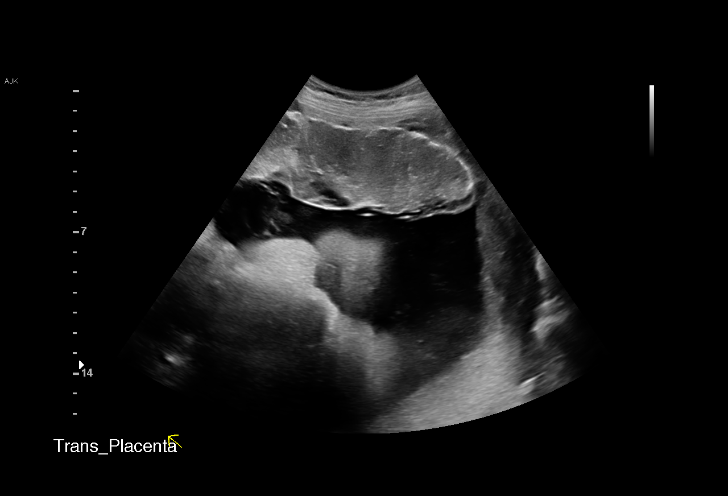
[im 26/28]
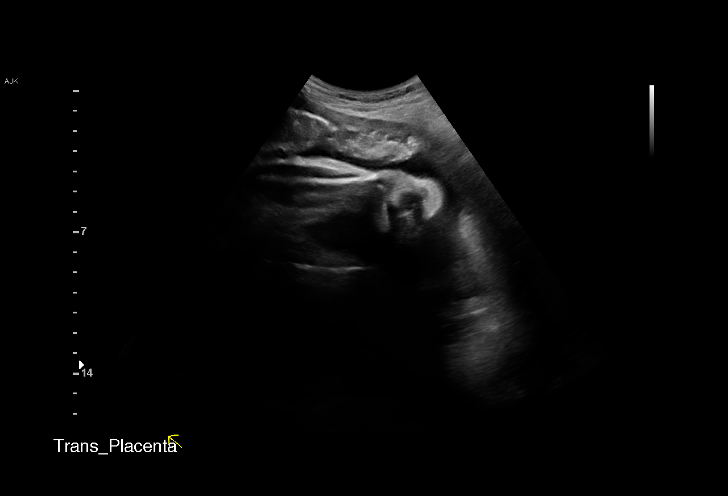
[im 28/28]
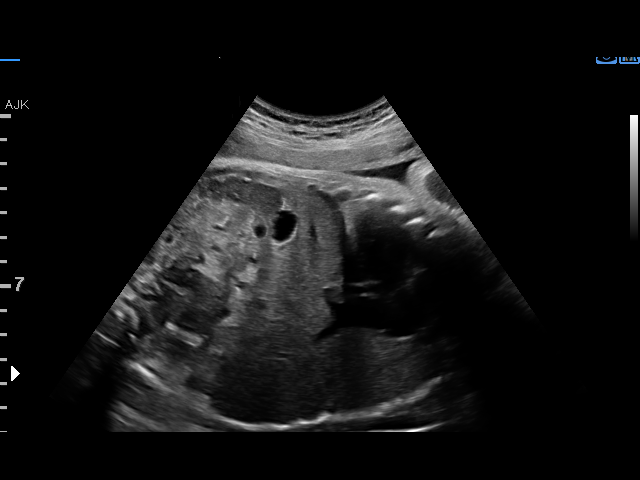

[15 of 28 positions shown; findings below may reference images not displayed]

Name:       YANG DOE              Visit Date: 07/14/2018 [DATE]

BLAIN

Indications

Chronic Hepatitis C complicating pregnancy,
antepartum
Substance abuse affecting pregnancy,
antepartum
Late to prenatal care, third trimester
Gestational diabetes in pregnancy, diet
controlled
Tobacco use complicating pregnancy, third
trimester
Rh negative state in antepartum
36 weeks gestation of pregnancy
Vital Signs

Height:        5'4"
Fetal Evaluation

Num Of Fetuses:          1
Fetal Heart              148
Rate(bpm):
Cardiac Activity:        Observed
Presentation:            Cephalic
Placenta:                Anterior
P. Cord Insertion:       Visualized

Amniotic Fluid
AFI FV:      Within normal limits
AFI Sum(cm)     %Tile       Largest Pocket(cm)
14.9            55

RUQ(cm)       RLQ(cm)       LUQ(cm)        LLQ(cm)
3.09
Biophysical Evaluation

Amniotic F.V:   Within normal limits       F. Tone:         Observed
F. Movement:    Observed                   Score:           [DATE]
F. Breathing:   Observed
OB History

Gravidity:    1
Gestational Age

LMP:           36w 3d        Date:  11/01/17                 EDD:    08/08/18
Best:          36w 3d     Det. By:  LMP  (11/01/17)          EDD:    08/08/18
Impression

Antenatal testing is reassuring. BPP [DATE].
Recommendations

Follow up as previously scheduled.

## 2018-11-17 ENCOUNTER — Encounter: Payer: Self-pay | Admitting: Family Medicine

## 2018-11-17 ENCOUNTER — Ambulatory Visit (INDEPENDENT_AMBULATORY_CARE_PROVIDER_SITE_OTHER): Payer: Medicaid Other | Admitting: Family Medicine

## 2018-11-17 VITALS — BP 115/74 | HR 82 | Ht 63.0 in | Wt 141.1 lb

## 2018-11-17 DIAGNOSIS — Z3040 Encounter for surveillance of contraceptives, unspecified: Secondary | ICD-10-CM | POA: Diagnosis not present

## 2018-11-17 NOTE — Progress Notes (Signed)
   Subjective:    Patient ID: Bartholomew Crews, female    DOB: 1991-08-18, 28 y.o.   MRN: [id number]  HPI Seen for surveillence of Nexplanon. No problems with device. Having a little spotting now. Mild cramps occasionally. No intramenstrual spotting. Had no periods with prior device. No skin reaction.   Review of Systems     Objective:   Physical Exam Constitutional:      Appearance: Normal appearance.  Cardiovascular:     Rate and Rhythm: Normal rate and regular rhythm.     Pulses: Normal pulses.  Pulmonary:     Effort: Pulmonary effort is normal.     Breath sounds: Normal breath sounds.  Skin:    General: Skin is warm and dry.     Comments: nexplanon palpated in left arm.  Neurological:     Mental Status: She is alert.       Assessment & Plan:  1. Encounter for surveillance of contraceptive device No concerns. F/u in 1 year for annual exam.

## 2019-08-09 ENCOUNTER — Ambulatory Visit: Payer: Medicaid Other | Admitting: Family Medicine

## 2019-09-17 ENCOUNTER — Other Ambulatory Visit: Payer: Self-pay

## 2019-09-17 DIAGNOSIS — Z20822 Contact with and (suspected) exposure to covid-19: Secondary | ICD-10-CM

## 2019-09-19 LAB — NOVEL CORONAVIRUS, NAA: SARS-CoV-2, NAA: NOT DETECTED

## 2019-09-24 ENCOUNTER — Telehealth: Payer: Self-pay | Admitting: Family Medicine

## 2019-09-24 NOTE — Telephone Encounter (Signed)
Patient already had result from My Chart

## 2019-10-08 ENCOUNTER — Ambulatory Visit: Payer: Medicaid Other | Admitting: Family Medicine

## 2019-12-17 ENCOUNTER — Telehealth: Payer: Self-pay

## 2019-12-17 DIAGNOSIS — B1920 Unspecified viral hepatitis C without hepatic coma: Secondary | ICD-10-CM

## 2019-12-17 NOTE — Telephone Encounter (Signed)
Patient called stating that she found out while she was pregnant (2019) that she had Hep C.  Patient states that she was originally referred to a doctor who "didn't want to do much while I was pregnant" Patient states now she is fatigued all the time and taking 3-4 naps a day. Patient would like evaluation with someone- wondering if we could put in another referral. Armandina Stammer RN

## 2019-12-17 NOTE — Telephone Encounter (Signed)
Referred to infectious disease for Hep C

## 2020-01-09 ENCOUNTER — Telehealth: Payer: Self-pay | Admitting: Pharmacy Technician

## 2020-01-09 ENCOUNTER — Other Ambulatory Visit: Payer: Self-pay

## 2020-01-09 ENCOUNTER — Encounter: Payer: Self-pay | Admitting: Family

## 2020-01-09 ENCOUNTER — Ambulatory Visit (INDEPENDENT_AMBULATORY_CARE_PROVIDER_SITE_OTHER): Payer: Medicaid Other | Admitting: Family

## 2020-01-09 VITALS — BP 130/78 | HR 95 | Temp 98.5°F | Ht 64.0 in | Wt 135.0 lb

## 2020-01-09 DIAGNOSIS — R768 Other specified abnormal immunological findings in serum: Secondary | ICD-10-CM | POA: Diagnosis present

## 2020-01-09 NOTE — Telephone Encounter (Signed)
[facility] Patient Advocate Encounter    Findings of the benefits investigation:   Insurance: [employer] Estimated copay amount: $3.00 able to pay copay  Prior Authorization: will begin insurance process once medication is prescribed  Readiness form has been filled out and signed by the patient. She prefers to have the medication sent via [facility] from [facility].   Beulah Gandy, CPhT [facility] Pharmacy Patient [facility] Phone: [phone] Fax: [phone] 01/09/2020 2:49 PM

## 2020-01-09 NOTE — Patient Instructions (Signed)
Nice to meet you.  We will check your blood work today and let you know the results.   We will schedule an appointment 1 month after starting treatment if needed.   Limit acetaminophen (Tylenol) usage to no more than 2 grams (2,000 mg) per day.  Avoid alcohol.  Do not share toothbrushes or razors.  Practice safe sex to protect against transmission as well as sexually transmitted disease.    Hepatitis C Hepatitis C is a viral infection of the liver. It can lead to scarring of the liver (cirrhosis), liver failure, or liver cancer. Hepatitis C may go undetected for months or years because people with the infection may not have symptoms, or they may have only mild symptoms. What are the causes? This condition is caused by the hepatitis C virus (HCV). The virus can spread from person to person (is contagious) through:  Blood.  Childbirth. A woman who has hepatitis C can pass it to her baby during birth.  Bodily fluids, such as breast milk, tears, semen, vaginal fluids, and saliva.  Blood transfusions or organ transplants done in the [place] before 1992.  What increases the risk? The following factors may make you more likely to develop this condition:  Having contact with unclean (contaminated) needles or syringes. This may result from: ? Acupuncture. ? Tattoing. ? Body piercing. ? Injecting drugs.  Having unprotected sex with someone who is infected.  Needing treatment to filter your blood (kidney dialysis).  Having HIV (human immunodeficiency virus) or AIDS (acquired immunodeficiency syndrome).  Working in a job that involves contact with blood or bodily fluids, such as health care.  What are the signs or symptoms? Symptoms of this condition include:  Fatigue.  Loss of appetite.  Nausea.  Vomiting.  Abdominal pain.  Dark yellow urine.  Yellowish skin and eyes (jaundice).  Itchy skin.  Clay-colored bowel movements.  Joint pain.  Bleeding and  bruising easily.  Fluid building up in your stomach (ascites).  In some cases, you may not have any symptoms. How is this diagnosed? This condition is diagnosed with:  Blood tests.  Other tests to check how well your liver is functioning. They may include: ? Magnetic resonance elastography (MRE). This imaging test uses MRIs and sound waves to measure liver stiffness. ? Transient elastography. This imaging test uses ultrasounds to measure liver stiffness. ? Liver biopsy. This test requires taking a small tissue sample from your liver to examine it under a microscope.  How is this treated? Your health care provider may perform noninvasive tests or a liver biopsy to help decide the best course of treatment. Treatment may include:  Antiviral medicines and other medicines.  Follow-up treatments every 6-12 months for infections or other liver conditions.  Receiving a donated liver (liver transplant).  Follow these instructions at home: Medicines  Take over-the-counter and prescription medicines only as told by your health care provider.  Take your antiviral medicine as told by your health care provider. Do not stop taking the antiviral even if you start to feel better.  Do not take any medicines unless approved by your health care provider, including over-the-counter medicines and birth control pills. Activity  Rest as needed.  Do not have sex unless approved by your health care provider.  Ask your health care provider when you may return to school or work. Eating and drinking  Eat a balanced diet with plenty of fruits and vegetables, whole grains, and lowfat (lean) meats or non-meat proteins (such as beans  or tofu).  Drink enough fluids to keep your urine clear or pale yellow.  Do not drink alcohol. General instructions  Do not share toothbrushes, nail clippers, or razors.  Wash your hands frequently with soap and water. If soap and water are not available, use hand  sanitizer.  Cover any cuts or open sores on your skin to prevent spreading the virus.  Keep all follow-up visits as told by your health care provider. This is important. You may need follow-up visits every 6-12 months. How is this prevented? There is no vaccine for hepatitis C. The only way to prevent the disease is to reduce the risk of exposure to the virus. Make sure you:  Wash your hands frequently with soap and water. If soap and water are not available, use hand sanitizer.  Do not share needles or syringes.  Practice safe sex and use condoms.  Avoid handling blood or bodily fluids without gloves or other protection.  Avoid getting tattoos or piercings in shops or other locations that are not clean.  Contact a health care provider if:  You have a fever.  You develop abdominal pain.  You pass dark urine.  You pass clay-colored stools.  You develop joint pain. Get help right away if:  You have increasing fatigue or weakness.  You lose your appetite.  You cannot eat or drink without vomiting.  You develop jaundice or your jaundice gets worse.  You bruise or bleed easily. Summary  Hepatitis C is a viral infection of the liver. It can lead to scarring of the liver (cirrhosis), liver failure, or liver cancer.  The hepatitis C virus (HCV) causes this condition. The virus can pass from person to person (is contagious).  You should not take any medicines unless approved by your health care provider. This includes over-the-counter medicines and birth control pills. This information is not intended to replace advice given to you by your health care provider. Make sure you discuss any questions you have with your health care provider. Document Released: 10/01/2000 Document Revised: 11/09/2016 Document Reviewed: 11/09/2016 Elsevier Interactive Patient Education  [employer].
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
## 2020-01-09 NOTE — Assessment & Plan Note (Signed)
Shelia Anderson is a 29 year old female with positive hepatitis C antibody and unknown hepatitis C status currently.  Risk factors include IV drug use and possibly sharing razors/toothbrushes.  She is treatment nave and currently has fatigue as her only symptom.  We discussed the pathogenesis, transmission, prevention, risks if left untreated, and treatment options for hepatitis C.  Check HIV status, hepatitis B status, hepatitis C blood work, and liver fibrosis scores.  She met with pharmacy to complete medication assistance paperwork.  Further treatment and medication decision pending blood work results.  Plan for follow-up 1 month after initiation of treatment if necessary.

## 2020-01-09 NOTE — Assessment & Plan Note (Deleted)
Shelia Anderson is a 29 year old female with positive hepatitis C antibody and unknown hepatitis C status currently.  Risk factors include IV drug use and possibly sharing razors/toothbrushes.  She is treatment nave and currently has fatigue as her only symptom.  We discussed the pathogenesis, transmission, prevention, risks if left untreated, and treatment options for hepatitis C.  Check HIV status, hepatitis B status, hepatitis C blood work, and liver fibrosis scores.  She met with pharmacy to complete medication assistance paperwork.  Further treatment and medication decision pending blood work results.  Plan for follow-up 1 month after initiation of treatment if necessary.

## 2020-01-16 ENCOUNTER — Other Ambulatory Visit: Payer: Self-pay | Admitting: Family

## 2020-01-16 LAB — LIVER FIBROSIS, FIBROTEST-ACTITEST
ALT: 40 U/L — ABNORMAL HIGH (ref 6–29)
Alpha-2-Macroglobulin: 223 mg/dL (ref 106–279)
Apolipoprotein A1: 192 mg/dL (ref 101–198)
Bilirubin: 0.6 mg/dL (ref 0.2–1.2)
Fibrosis Score: 0.08
GGT: 26 U/L (ref 3–50)
Haptoglobin: 163 mg/dL (ref 43–212)
Necroinflammat ACT Score: 0.17
Reference ID: 3327381

## 2020-01-16 LAB — HEPATIC FUNCTION PANEL
AG Ratio: 1.7 (calc) (ref 1.0–2.5)
ALT: 41 U/L — ABNORMAL HIGH (ref 6–29)
AST: 23 U/L (ref 10–30)
Albumin: 4.7 g/dL (ref 3.6–5.1)
Alkaline phosphatase (APISO): 70 U/L (ref 31–125)
Bilirubin, Direct: 0.1 mg/dL (ref 0.0–0.2)
Globulin: 2.7 g/dL (calc) (ref 1.9–3.7)
Indirect Bilirubin: 0.5 mg/dL (calc) (ref 0.2–1.2)
Total Bilirubin: 0.6 mg/dL (ref 0.2–1.2)
Total Protein: 7.4 g/dL (ref 6.1–8.1)

## 2020-01-16 LAB — HEPATITIS C GENOTYPE

## 2020-01-16 LAB — BASIC METABOLIC PANEL
BUN: 13 mg/dL (ref 7–25)
CO2: 28 mmol/L (ref 20–32)
Calcium: 9.3 mg/dL (ref 8.6–10.2)
Chloride: 106 mmol/L (ref 98–110)
Creat: 0.61 mg/dL (ref 0.50–1.10)
Glucose, Bld: 62 mg/dL — ABNORMAL LOW (ref 65–99)
Potassium: 4 mmol/L (ref 3.5–5.3)
Sodium: 138 mmol/L (ref 135–146)

## 2020-01-16 LAB — CBC
HCT: 38.4 % (ref 35.0–45.0)
Hemoglobin: 13.1 g/dL (ref 11.7–15.5)
MCH: 29.8 pg (ref 27.0–33.0)
MCHC: 34.1 g/dL (ref 32.0–36.0)
MCV: 87.5 fL (ref 80.0–100.0)
MPV: 9.6 fL (ref 7.5–12.5)
Platelets: 491 10*3/uL — ABNORMAL HIGH (ref 140–400)
RBC: 4.39 10*6/uL (ref 3.80–5.10)
RDW: 15.2 % — ABNORMAL HIGH (ref 11.0–15.0)
WBC: 6.4 10*3/uL (ref 3.8–10.8)

## 2020-01-16 LAB — PROTIME-INR
INR: 1
Prothrombin Time: 10.4 s (ref 9.0–11.5)

## 2020-01-16 LAB — HEPATITIS C RNA QUANTITATIVE
HCV Quantitative Log: 3.61 Log IU/mL — ABNORMAL HIGH
HCV RNA, PCR, QN: 4050 IU/mL — ABNORMAL HIGH

## 2020-01-16 LAB — HIV ANTIBODY (ROUTINE TESTING W REFLEX): HIV 1&2 Ab, 4th Generation: NONREACTIVE

## 2020-01-16 LAB — HEPATITIS B SURFACE ANTIBODY,QUALITATIVE: Hep B S Ab: NONREACTIVE

## 2020-01-16 LAB — HEPATITIS B SURFACE ANTIGEN: Hepatitis B Surface Ag: NONREACTIVE

## 2020-01-16 MED ORDER — MAVYRET 100-40 MG PO TABS: 3.0000 | ORAL_TABLET | Freq: Every day | ORAL | 1 refills | Status: AC

## 2020-01-16 NOTE — Progress Notes (Signed)
Ms. Lippard has Genotype 1a Chronic Hepatitis C with initial viral load of 4,050 and fibrosis score of F0. Will treat with 8 weeks of Mavyret submitted to [facility].

## 2020-01-21 ENCOUNTER — Encounter: Payer: Self-pay | Admitting: Pharmacy Technician

## 2020-01-21 NOTE — Progress Notes (Signed)
Will start prior authorization with [employer]. Mavyret is preferred so should not have trouble. Will communicate to the patient the process

## 2020-01-21 NOTE — Telephone Encounter (Signed)
Call placed with the patient as an update on the prior authorization process, had to leave a voicemail. Will contact her later this week with update or approval status

## 2020-01-23 ENCOUNTER — Telehealth: Payer: Self-pay | Admitting: Pharmacy Technician

## 2020-01-23 NOTE — Telephone Encounter (Signed)
[facility] Patient Advocate Encounter   Received notification from [employer] that prior authorization for Mavyret is required.   PA submitted on 01/23/2020 Key [id number] W Status is pending Recipient ID: [id number] [facility] will continue to follow.  Beulah Gandy, CPhT Specialty Pharmacy Patient [facility] for Infectious Disease Phone: [phone] Fax: [phone] 01/23/2020 7:45 AM

## 2020-01-23 NOTE — Telephone Encounter (Addendum)
[facility] Patient Advocate Encounter  Prior Authorization for Mavyret has been approved.    PA# [id number] W Effective dates: 01/23/2020 through 03/23/2020  Patients co-pay is $3.00.   [facility] will continue to follow to see if patient wants mail and verify address. Left voicemail and patient messaging to alert the patient.  Beulah Gandy, CPhT Specialty Pharmacy Patient [facility] for Infectious Disease Phone: [phone] Fax: [phone] 01/23/2020 10:30 AM

## 2020-01-25 MED FILL — MAVYRET 100-40 MG TABS: 100-40 | 28 days supply | Qty: 84 | Fill #0

## 2020-01-28 ENCOUNTER — Telehealth: Payer: Self-pay

## 2020-01-28 NOTE — Telephone Encounter (Signed)
Called patient to counsel on new HCV medication Mavyret. Patient did not answer, and I left a HIPAA compliant voicemail.   Ellison Carwin, PharmD PGY1 Pharmacy Resident

## 2020-01-31 ENCOUNTER — Encounter: Payer: Self-pay | Admitting: Pharmacist

## 2020-02-08 ENCOUNTER — Telehealth: Payer: Self-pay

## 2020-02-08 NOTE — Telephone Encounter (Signed)
Attempted to call patient to counsel about Mavyret. Patient did not answer, and I left voicemail.

## 2020-02-20 MED FILL — MAVYRET 100-40 MG TABS: 100-40 | 28 days supply | Qty: 84 | Fill #1

## 2020-02-26 ENCOUNTER — Ambulatory Visit: Payer: Medicaid Other | Admitting: Pharmacist

## 2020-04-02 ENCOUNTER — Ambulatory Visit (INDEPENDENT_AMBULATORY_CARE_PROVIDER_SITE_OTHER)
Admission: RE | Admit: 2020-04-02 | Discharge: 2020-04-02 | Disposition: A | Discharge status: Home / Self Care | Payer: Medicaid Other | Source: Ambulatory Visit

## 2020-04-02 DIAGNOSIS — J029 Acute pharyngitis, unspecified: Secondary | ICD-10-CM

## 2020-04-02 NOTE — Discharge Instructions (Addendum)
Take Tylenol or ibuprofen as needed for fever or discomfort.    Follow up with your primary care provider or come here to be seen in person if your symptoms are not improving.

## 2020-04-02 NOTE — ED Provider Notes (Signed)
Virtual Visit via Video Note:  Shelia Anderson  initiated request for Telemedicine visit with [facility] team. I connected with Shelia Anderson  on 04/02/2020 at 4:43 PM  for a synchronized telemedicine visit using a video enabled HIPPA compliant telemedicine application. I verified that I am speaking with Shelia Anderson  using two identifiers. Mickie Bail, NP  was physically located in a [facility] Urgent care site and Shelia Anderson was located at a different location.   The limitations of evaluation and management by telemedicine as well as the availability of in-person appointments were discussed. Patient was informed that she  may incur a bill ( including co-pay) for this virtual visit encounter. Shelia Anderson  expressed understanding and gave verbal consent to proceed with virtual visit.     History of Present Illness:Shelia Anderson  is a 29 y.o. female presents for evaluation of sore throat x 2 days.  She also reports white patches on her throat.  Temp 99.9.  No difficulty swallowing or breathing.  She denies cough, shortness of breath, vomiting, diarrhea, rash, or other symptoms.  Treatment attempted with ibuprofen.  She denies current pregnancy or breastfeeding.      Allergies  Allergen Reactions  . Latex Itching and Hives     Past Medical History:  Diagnosis Date  . BV (bacterial vaginosis)   . Depression   . Hepatitis-C   . Substance abuse ([facility])    Heroin      Social History   Tobacco Use  . Smoking status: Current Every Day Smoker    Packs/day: 0.50    Types: Cigarettes  . Smokeless tobacco: Never Used  Vaping Use  . Vaping Use: Never used  Substance Use Topics  . Alcohol use: Yes    Comment: Occasionally  . Drug use: Not Currently    Types: Amphetamines, "Crack" cocaine, Cocaine, Marijuana, Methamphetamines, Opium    Comment: Pt reports no drug usuage in 1.5 years since rehab.    ROS: as stated in HPI.  All other systems  reviewed and negative.     Observations/Objective: Physical Exam  VITALS: Patient denies fever. GENERAL: Alert, appears well and in no acute distress. HEENT: Atraumatic. Oral mucosa appears moist. NECK: Normal movements of the head and neck. CARDIOPULMONARY: No increased WOB. Speaking in clear sentences. I:E ratio WNL.  MS: Moves all visible extremities without noticeable abnormality. PSYCH: Pleasant and cooperative, well-groomed. Speech normal rate and rhythm. Affect is appropriate. Insight and judgement are appropriate. Attention is focused, linear, and appropriate.  NEURO: CN grossly intact. Oriented as arrived to appointment on time with no prompting. Moves both UE equally.  SKIN: No obvious lesions, wounds, erythema, or cyanosis noted on face or hands.   Assessment and Plan:    ICD-10-CM   1. Sore throat  J02.9        Follow Up Instructions: Instructed patient to take Tylenol or ibuprofen as needed for discomfort or fever.  Discussed that she should come for in-person visit or see her PCP for possible strep test.  Patient agrees to plan of care.      I discussed the assessment and treatment plan with the patient. The patient was provided an opportunity to ask questions and all were answered. The patient agreed with the plan and demonstrated an understanding of the instructions.   The patient was advised to call back or seek an in-person evaluation if the symptoms worsen or if the condition fails to  improve as anticipated.      Sharion Balloon, NP  04/02/2020 4:43 PM         Sharion Balloon, NP 04/02/20 1643

## 2020-04-03 ENCOUNTER — Other Ambulatory Visit: Payer: Self-pay

## 2020-04-03 ENCOUNTER — Ambulatory Visit (INDEPENDENT_AMBULATORY_CARE_PROVIDER_SITE_OTHER): Payer: Medicaid Other | Admitting: Pharmacist

## 2020-04-03 DIAGNOSIS — R768 Other specified abnormal immunological findings in serum: Secondary | ICD-10-CM
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
## 2020-04-05 LAB — COMPREHENSIVE METABOLIC PANEL
AG Ratio: 1.7 (calc) (ref 1.0–2.5)
ALT: 9 U/L (ref 6–29)
AST: 12 U/L (ref 10–30)
Albumin: 4.3 g/dL (ref 3.6–5.1)
Alkaline phosphatase (APISO): 80 U/L (ref 31–125)
BUN: 9 mg/dL (ref 7–25)
CO2: 28 mmol/L (ref 20–32)
Calcium: 9.4 mg/dL (ref 8.6–10.2)
Chloride: 104 mmol/L (ref 98–110)
Creat: 0.58 mg/dL (ref 0.50–1.10)
Globulin: 2.5 g/dL (calc) (ref 1.9–3.7)
Glucose, Bld: 65 mg/dL (ref 65–99)
Potassium: 4.3 mmol/L (ref 3.5–5.3)
Sodium: 138 mmol/L (ref 135–146)
Total Bilirubin: 0.4 mg/dL (ref 0.2–1.2)
Total Protein: 6.8 g/dL (ref 6.1–8.1)

## 2020-04-05 LAB — HEPATITIS C RNA QUANTITATIVE
HCV Quantitative Log: <1.18 Log IU/mL
HCV RNA, PCR, QN: <15 IU/mL

## 2020-05-26 ENCOUNTER — Ambulatory Visit: Payer: Medicaid Other | Admitting: Obstetrics & Gynecology

## 2020-06-19 ENCOUNTER — Ambulatory Visit (INDEPENDENT_AMBULATORY_CARE_PROVIDER_SITE_OTHER): Payer: Medicaid Other | Admitting: Family Medicine

## 2020-06-19 ENCOUNTER — Encounter: Payer: Self-pay | Admitting: Family Medicine

## 2020-06-19 ENCOUNTER — Other Ambulatory Visit (HOSPITAL_COMMUNITY)
Admission: RE | Admit: 2020-06-19 | Discharge: 2020-06-19 | Disposition: A | Discharge status: Home / Self Care | Payer: Medicaid Other | Source: Ambulatory Visit | Attending: Family Medicine | Admitting: Family Medicine

## 2020-06-19 ENCOUNTER — Other Ambulatory Visit: Payer: Self-pay

## 2020-06-19 VITALS — BP 125/86 | HR 90 | Ht 63.0 in | Wt 152.0 lb

## 2020-06-19 DIAGNOSIS — Z0000 Encounter for general adult medical examination without abnormal findings: Secondary | ICD-10-CM | POA: Diagnosis not present

## 2020-06-19 DIAGNOSIS — B1920 Unspecified viral hepatitis C without hepatic coma: Secondary | ICD-10-CM

## 2020-06-19 DIAGNOSIS — N766 Ulceration of vulva: Secondary | ICD-10-CM | POA: Diagnosis not present

## 2020-06-19 DIAGNOSIS — Z01419 Encounter for gynecological examination (general) (routine) without abnormal findings: Secondary | ICD-10-CM | POA: Diagnosis present

## 2020-06-19 NOTE — Progress Notes (Addendum)
GYNECOLOGY ANNUAL PREVENTATIVE CARE ENCOUNTER NOTE  Subjective:   Shelia Anderson is a 29 y.o. G63P0101 female here for a routine annual gynecologic exam.  Current complaints: pain at introitus that started a couple months ago. Had new sexual partner.   Denies abnormal vaginal bleeding, discharge, pelvic pain, problems with intercourse or other gynecologic concerns.    Gynecologic History Patient's last menstrual period was 05/20/2020. Patient is sexually active  Contraception: Nexplanon Last Pap: 2018. Results were: normal   Obstetric History OB History  Gravida Para Term Preterm AB Living  1 1   1   1   SAB TAB Ectopic Multiple Live Births        0 1    # Outcome Date GA Lbr Len/2nd Weight Sex Delivery Anes PTL Lv  1 Preterm 07/15/18 [redacted]w[redacted]d 11:30 / 01:10 7 lb 15.3 oz (3.61 kg) F Vag-Spont EPI  LIV    Past Medical History:  Diagnosis Date  . BV (bacterial vaginosis)   . Depression   . Hepatitis-C   . Substance abuse (HCC)    Heroin     Past Surgical History:  Procedure Laterality Date  . NO PAST SURGERIES      Current Outpatient Medications on File Prior to Visit  Medication Sig Dispense Refill  . etonogestrel (NEXPLANON) 68 MG IMPL implant 1 each by Subdermal route once.    . Glecaprevir-Pibrentasvir (MAVYRET) 100-40 MG TABS Take 3 tablets by mouth daily. Take 3 tablets by mouth daily with a meal. (Patient not taking: Reported on 06/19/2020) 84 tablet 1   No current facility-administered medications on file prior to visit.    Allergies  Allergen Reactions  . Latex Itching and Hives    Social History   Socioeconomic History  . Marital status: Single    Spouse name: Not on file  . Number of children: 1  . Years of education: Not on file  . Highest education level: Not on file  Occupational History  . Not on file  Tobacco Use  . Smoking status: Current Every Day Smoker    Packs/day: 0.50    Types: Cigarettes  . Smokeless tobacco: Never Used  Vaping  Use  . Vaping Use: Never used  Substance and Sexual Activity  . Alcohol use: Yes    Comment: Occasionally  . Drug use: Not Currently    Types: Amphetamines, "Crack" cocaine, Cocaine, Marijuana, Methamphetamines, Opium    Comment: Pt reports no drug usuage in 1.5 years since rehab.  . Sexual activity: Yes    Birth control/protection: None  Other Topics Concern  . Not on file  Social History Narrative  . Not on file   Social Determinants of Health   Financial Resource Strain:   . Difficulty of Paying Living Expenses: Not on file  Food Insecurity:   . Worried About 08/19/2020 in the Last Year: Not on file  . Ran Out of Food in the Last Year: Not on file  Transportation Needs:   . Lack of Transportation (Medical): Not on file  . Lack of Transportation (Non-Medical): Not on file  Physical Activity:   . Days of Exercise per Week: Not on file  . Minutes of Exercise per Session: Not on file  Stress:   . Feeling of Stress : Not on file  Social Connections:   . Frequency of Communication with Friends and Family: Not on file  . Frequency of Social Gatherings with Friends and Family: Not on file  .  Attends Religious Services: Not on file  . Active Member of Clubs or Organizations: Not on file  . Attends Banker Meetings: Not on file  . Marital Status: Not on file  Intimate Partner Violence:   . Fear of Current or Ex-Partner: Not on file  . Emotionally Abused: Not on file  . Physically Abused: Not on file  . Sexually Abused: Not on file    No family history on file.  The following portions of the patient's history were reviewed and updated as appropriate: allergies, current medications, past family history, past medical history, past social history, past surgical history and problem list.  Review of Systems Pertinent items are noted in HPI.   Objective:  BP 125/86   Pulse 90   Ht 5\' 3"  (1.6 m)   Wt 152 lb (68.9 kg)   LMP 05/20/2020   BMI 26.93 kg/m    Wt Readings from Last 3 Encounters:  06/19/20 152 lb (68.9 kg)  01/09/20 135 lb (61.2 kg)  11/17/18 141 lb 1.3 oz (64 kg)     Chaperone present during exam  CONSTITUTIONAL: Well-developed, well-nourished female in no acute distress.  HENT:  Normocephalic, atraumatic, External right and left ear normal. Oropharynx is clear and moist EYES: Conjunctivae and EOM are normal. Pupils are equal, round, and reactive to light. No scleral icterus.  NECK: Normal range of motion, supple, no masses.  Normal thyroid.   CARDIOVASCULAR: Normal heart rate noted, regular rhythm RESPIRATORY: Clear to auscultation bilaterally. Effort and breath sounds normal, no problems with respiration noted. BREASTS: Symmetric in size. No masses, skin changes, nipple drainage, or lymphadenopathy. ABDOMEN: Soft, normal bowel sounds, no distention noted.  No tenderness, rebound or guarding.  PELVIC: Normal appearing external genitalia. 57mm ulceration at the inferior aspect of the introitus. Otherwise normal appearing vaginal mucosa and cervix.  No abnormal discharge noted. MUSCULOSKELETAL: Normal range of motion. No tenderness.  No cyanosis, clubbing, or edema.  2+ distal pulses. SKIN: Skin is warm and dry. No rash noted. Not diaphoretic. No erythema. No pallor. NEUROLOGIC: Alert and oriented to person, place, and time. Normal reflexes, muscle tone coordination. No cranial nerve deficit noted. PSYCHIATRIC: Normal mood and affect. Normal behavior. Normal judgment and thought content.  Assessment:  Annual gynecologic examination with pap smear   Plan:  1. Well Woman Exam Will follow up results of pap smear and manage accordingly. STD testing discussed. Patient requested testing - Cytology - PAP( ) - HIV antibody (with reflex) - RPR - Hepatitis C Antibody - Hepatitis B Surface AntiGEN  2. Hepatitis C virus infection without hepatic coma, unspecified chronicity S/p treatment  3. Genital ulcer,  female Check HSV, RPR, HIV - Herpes simplex virus culture   Routine preventative health maintenance measures emphasized. Please refer to After Visit Summary for other counseling recommendations.    11m, DO [facility]

## 2020-06-20 LAB — CYTOLOGY - PAP
Chlamydia: NEGATIVE
Comment: NEGATIVE
Comment: NEGATIVE
Comment: NEGATIVE
Comment: NORMAL
Diagnosis: NEGATIVE
High risk HPV: NEGATIVE
Neisseria Gonorrhea: NEGATIVE
Trichomonas: POSITIVE — AB

## 2020-06-22 LAB — HERPES SIMPLEX VIRUS CULTURE

## 2020-06-24 ENCOUNTER — Other Ambulatory Visit: Payer: Self-pay

## 2020-06-24 DIAGNOSIS — A599 Trichomoniasis, unspecified: Secondary | ICD-10-CM

## 2020-06-24 MED ORDER — METRONIDAZOLE 500 MG PO TABS: 500.0000 mg | ORAL_TABLET | Freq: Once | ORAL | 0 refills | Status: DC

## 2020-06-24 MED ORDER — AZITHROMYCIN 500 MG PO TABS: 1000.0000 mg | ORAL_TABLET | Freq: Once | ORAL | 1 refills | Status: AC

## 2020-06-24 MED ORDER — METRONIDAZOLE 500 MG PO TABS: ORAL_TABLET | ORAL | 1 refills | Status: DC

## 2020-06-24 NOTE — Addendum Note (Signed)
Addended by: Levie Heritage on: 06/24/2020 03:41 PM   Modules accepted: Orders

## 2020-06-24 NOTE — Progress Notes (Signed)
Pt aware of positive Trich results via Mychart. Flagyl 2000 mg was sent to pt's pharmacy.  Clif Serio l Mickell Birdwell, CMA

## 2020-07-01 NOTE — Telephone Encounter (Signed)
Just retreat. Can we add a refill on it for her partner? Thanks!

## 2020-07-02 ENCOUNTER — Other Ambulatory Visit: Payer: Self-pay | Admitting: Pharmacist

## 2020-07-02 ENCOUNTER — Other Ambulatory Visit: Payer: Self-pay

## 2020-07-02 DIAGNOSIS — R768 Other specified abnormal immunological findings in serum: Secondary | ICD-10-CM

## 2020-07-02 DIAGNOSIS — A599 Trichomoniasis, unspecified: Secondary | ICD-10-CM

## 2020-07-02 MED ORDER — METRONIDAZOLE 500 MG PO TABS: ORAL_TABLET | ORAL | 1 refills | Status: DC

## 2020-07-03 ENCOUNTER — Ambulatory Visit: Payer: Medicaid Other | Admitting: Pharmacist

## 2020-07-03 ENCOUNTER — Other Ambulatory Visit: Payer: Medicaid Other

## 2020-08-14 DIAGNOSIS — A599 Trichomoniasis, unspecified: Secondary | ICD-10-CM

## 2020-08-14 MED ORDER — METRONIDAZOLE 500 MG PO TABS: 500.0000 mg | ORAL_TABLET | Freq: Two times a day (BID) | ORAL | 0 refills | Status: AC

## 2020-10-30 MED ORDER — METRONIDAZOLE 500 MG PO TABS: 500.0000 mg | ORAL_TABLET | Freq: Two times a day (BID) | ORAL | 0 refills | Status: DC

## 2020-10-30 NOTE — Addendum Note (Signed)
Addended by: Levie Heritage on: 10/30/2020 05:09 PM   Modules accepted: Orders

## 2020-10-31 MED ORDER — METRONIDAZOLE 500 MG PO TABS: 500.0000 mg | ORAL_TABLET | Freq: Two times a day (BID) | ORAL | 0 refills | Status: DC

## 2020-10-31 NOTE — Addendum Note (Signed)
Addended by: Levie Heritage on: 10/31/2020 09:17 AM   Modules accepted: Orders

## 2021-02-03 ENCOUNTER — Encounter: Payer: Medicaid Other | Admitting: Adult Health

## 2021-03-26 ENCOUNTER — Other Ambulatory Visit: Payer: Self-pay

## 2021-03-26 MED ORDER — METRONIDAZOLE 500 MG PO TABS: 500.0000 mg | ORAL_TABLET | Freq: Two times a day (BID) | ORAL | 0 refills | Status: DC

## 2021-07-22 ENCOUNTER — Encounter: Payer: Medicaid Other | Admitting: Adult Health

## 2021-07-23 ENCOUNTER — Encounter: Payer: Medicaid Other | Admitting: Adult Health

## 2021-07-27 ENCOUNTER — Telehealth: Payer: Self-pay

## 2021-07-27 NOTE — Telephone Encounter (Signed)
Called patient to get appointment scheduled, left voicemail for patient to call us back.

## 2021-07-29 ENCOUNTER — Ambulatory Visit: Payer: Medicaid Other | Admitting: Family

## 2021-07-30 ENCOUNTER — Encounter: Payer: Medicaid Other | Admitting: Adult Health

## 2021-08-03 ENCOUNTER — Other Ambulatory Visit: Payer: Self-pay

## 2021-08-03 MED ORDER — METRONIDAZOLE 500 MG PO TABS
500.0000 mg | ORAL_TABLET | Freq: Two times a day (BID) | ORAL | 0 refills | Status: DC
  Filled 2021-08-03: qty 14, 7d supply, fill #0

## 2021-09-05 ENCOUNTER — Other Ambulatory Visit: Payer: Self-pay | Admitting: Family Medicine

## 2021-09-07 ENCOUNTER — Telehealth: Payer: Self-pay

## 2021-09-07 ENCOUNTER — Other Ambulatory Visit: Payer: Self-pay

## 2021-09-07 MED ORDER — METRONIDAZOLE 500 MG PO TABS
500.0000 mg | ORAL_TABLET | Freq: Two times a day (BID) | ORAL | 0 refills | Status: AC
  Filled 2021-09-07: qty 14, 7d supply, fill #0

## 2021-09-07 NOTE — Telephone Encounter (Signed)
Called patient to get an appointment scheduled, left voicemail for patient to call us back to get scheduled. 

## 2021-09-15 ENCOUNTER — Other Ambulatory Visit: Payer: Self-pay

## 2021-09-17 ENCOUNTER — Encounter: Payer: Medicaid Other | Admitting: Adult Health

## 2022-02-01 ENCOUNTER — Encounter: Payer: Medicaid Other | Admitting: Women's Health
